# Patient Record
Sex: Female | Born: 1955
Health system: Southern US, Community
[De-identification: ages and names within clinical notes are randomized; demographics above are authoritative.]

## PROBLEM LIST (undated history)

## (undated) DIAGNOSIS — K769 Liver disease, unspecified: Secondary | ICD-10-CM

## (undated) DIAGNOSIS — N189 Chronic kidney disease, unspecified: Secondary | ICD-10-CM

## (undated) DIAGNOSIS — F419 Anxiety disorder, unspecified: Secondary | ICD-10-CM

## (undated) DIAGNOSIS — E119 Type 2 diabetes mellitus without complications: Secondary | ICD-10-CM

## (undated) DIAGNOSIS — F32A Depression, unspecified: Secondary | ICD-10-CM

## (undated) DIAGNOSIS — F329 Major depressive disorder, single episode, unspecified: Secondary | ICD-10-CM

## (undated) DIAGNOSIS — I10 Essential (primary) hypertension: Secondary | ICD-10-CM

## (undated) HISTORY — DX: Anxiety disorder, unspecified: F41.9

## (undated) HISTORY — DX: Chronic kidney disease, unspecified: N18.9

## (undated) HISTORY — PX: SHOULDER SURGERY: SHX246

## (undated) HISTORY — DX: Essential (primary) hypertension: I10

## (undated) HISTORY — DX: Liver disease, unspecified: K76.9

## (undated) HISTORY — PX: INCONTINENCE SURGERY: SHX676

## (undated) HISTORY — PX: JOINT REPLACEMENT: SHX530

## (undated) HISTORY — PX: HEMORRHOID SURGERY: SHX153

## (undated) HISTORY — DX: Type 2 diabetes mellitus without complications: E11.9

## (undated) HISTORY — PX: HERNIA REPAIR: SHX51

## (undated) HISTORY — PX: CHOLECYSTECTOMY: SHX55

## (undated) HISTORY — DX: Depression, unspecified: F32.A

## (undated) HISTORY — PX: SPINE SURGERY: SHX786

## (undated) HISTORY — PX: ABDOMINAL HYSTERECTOMY: SHX81

---
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
## 2015-11-18 HISTORY — PX: BREAST BIOPSY: SHX20

## 2017-04-30 DIAGNOSIS — M25561 Pain in right knee: Secondary | ICD-10-CM | POA: Diagnosis not present

## 2017-04-30 DIAGNOSIS — M25562 Pain in left knee: Secondary | ICD-10-CM | POA: Diagnosis not present

## 2017-04-30 DIAGNOSIS — E1165 Type 2 diabetes mellitus with hyperglycemia: Secondary | ICD-10-CM | POA: Diagnosis not present

## 2017-04-30 DIAGNOSIS — R42 Dizziness and giddiness: Secondary | ICD-10-CM | POA: Diagnosis not present

## 2017-04-30 DIAGNOSIS — Z0000 Encounter for general adult medical examination without abnormal findings: Secondary | ICD-10-CM | POA: Diagnosis not present

## 2017-04-30 DIAGNOSIS — Z760 Encounter for issue of repeat prescription: Secondary | ICD-10-CM | POA: Diagnosis not present

## 2017-05-14 DIAGNOSIS — M25561 Pain in right knee: Secondary | ICD-10-CM | POA: Diagnosis not present

## 2017-05-14 DIAGNOSIS — E1165 Type 2 diabetes mellitus with hyperglycemia: Secondary | ICD-10-CM | POA: Diagnosis not present

## 2017-05-14 DIAGNOSIS — M25562 Pain in left knee: Secondary | ICD-10-CM | POA: Diagnosis not present

## 2017-05-14 DIAGNOSIS — M545 Low back pain: Secondary | ICD-10-CM | POA: Diagnosis not present

## 2017-05-25 DIAGNOSIS — Z96651 Presence of right artificial knee joint: Secondary | ICD-10-CM | POA: Diagnosis not present

## 2017-05-25 DIAGNOSIS — M1712 Unilateral primary osteoarthritis, left knee: Secondary | ICD-10-CM | POA: Diagnosis not present

## 2017-05-25 DIAGNOSIS — T8484XA Pain due to internal orthopedic prosthetic devices, implants and grafts, initial encounter: Secondary | ICD-10-CM | POA: Diagnosis not present

## 2017-05-25 DIAGNOSIS — M6281 Muscle weakness (generalized): Secondary | ICD-10-CM | POA: Diagnosis not present

## 2017-06-26 DIAGNOSIS — T8484XA Pain due to internal orthopedic prosthetic devices, implants and grafts, initial encounter: Secondary | ICD-10-CM | POA: Diagnosis not present

## 2017-06-26 DIAGNOSIS — E119 Type 2 diabetes mellitus without complications: Secondary | ICD-10-CM | POA: Diagnosis not present

## 2017-06-26 DIAGNOSIS — B372 Candidiasis of skin and nail: Secondary | ICD-10-CM | POA: Diagnosis not present

## 2017-06-26 DIAGNOSIS — Z96659 Presence of unspecified artificial knee joint: Secondary | ICD-10-CM | POA: Diagnosis not present

## 2017-06-26 DIAGNOSIS — M25569 Pain in unspecified knee: Secondary | ICD-10-CM | POA: Diagnosis not present

## 2017-07-09 DIAGNOSIS — T8484XA Pain due to internal orthopedic prosthetic devices, implants and grafts, initial encounter: Secondary | ICD-10-CM | POA: Diagnosis not present

## 2017-07-09 DIAGNOSIS — L02214 Cutaneous abscess of groin: Secondary | ICD-10-CM | POA: Diagnosis not present

## 2017-07-09 DIAGNOSIS — R42 Dizziness and giddiness: Secondary | ICD-10-CM | POA: Diagnosis not present

## 2017-07-09 DIAGNOSIS — F419 Anxiety disorder, unspecified: Secondary | ICD-10-CM | POA: Diagnosis not present

## 2017-07-23 DIAGNOSIS — L720 Epidermal cyst: Secondary | ICD-10-CM | POA: Diagnosis not present

## 2017-07-23 DIAGNOSIS — L723 Sebaceous cyst: Secondary | ICD-10-CM | POA: Diagnosis not present

## 2017-07-23 DIAGNOSIS — Z471 Aftercare following joint replacement surgery: Secondary | ICD-10-CM | POA: Diagnosis not present

## 2017-07-23 DIAGNOSIS — L02214 Cutaneous abscess of groin: Secondary | ICD-10-CM | POA: Diagnosis not present

## 2017-07-23 DIAGNOSIS — Z96651 Presence of right artificial knee joint: Secondary | ICD-10-CM | POA: Diagnosis not present

## 2017-07-23 DIAGNOSIS — F419 Anxiety disorder, unspecified: Secondary | ICD-10-CM | POA: Diagnosis not present

## 2017-07-23 DIAGNOSIS — T8484XD Pain due to internal orthopedic prosthetic devices, implants and grafts, subsequent encounter: Secondary | ICD-10-CM | POA: Diagnosis not present

## 2017-07-27 DIAGNOSIS — H832X9 Labyrinthine dysfunction, unspecified ear: Secondary | ICD-10-CM | POA: Diagnosis not present

## 2017-07-27 DIAGNOSIS — M25511 Pain in right shoulder: Secondary | ICD-10-CM | POA: Diagnosis not present

## 2017-07-27 DIAGNOSIS — R42 Dizziness and giddiness: Secondary | ICD-10-CM | POA: Diagnosis not present

## 2017-07-27 DIAGNOSIS — M545 Low back pain: Secondary | ICD-10-CM | POA: Diagnosis not present

## 2017-07-30 ENCOUNTER — Other Ambulatory Visit: Payer: Self-pay | Admitting: Orthopedic Surgery

## 2017-07-30 DIAGNOSIS — M545 Low back pain: Secondary | ICD-10-CM

## 2017-08-04 DIAGNOSIS — R42 Dizziness and giddiness: Secondary | ICD-10-CM | POA: Diagnosis not present

## 2017-08-13 ENCOUNTER — Ambulatory Visit
Admission: RE | Admit: 2017-08-13 | Discharge: 2017-08-13 | Disposition: A | Discharge status: Home / Self Care | Payer: PPO | Source: Ambulatory Visit | Attending: Orthopedic Surgery | Admitting: Orthopedic Surgery

## 2017-08-13 DIAGNOSIS — M5126 Other intervertebral disc displacement, lumbar region: Secondary | ICD-10-CM | POA: Diagnosis not present

## 2017-08-13 DIAGNOSIS — E041 Nontoxic single thyroid nodule: Secondary | ICD-10-CM | POA: Diagnosis not present

## 2017-08-13 DIAGNOSIS — R933 Abnormal findings on diagnostic imaging of other parts of digestive tract: Secondary | ICD-10-CM | POA: Diagnosis not present

## 2017-08-13 DIAGNOSIS — M545 Low back pain: Secondary | ICD-10-CM

## 2017-08-13 DIAGNOSIS — R42 Dizziness and giddiness: Secondary | ICD-10-CM | POA: Diagnosis not present

## 2017-08-24 DIAGNOSIS — L723 Sebaceous cyst: Secondary | ICD-10-CM | POA: Diagnosis not present

## 2017-08-24 DIAGNOSIS — E041 Nontoxic single thyroid nodule: Secondary | ICD-10-CM | POA: Diagnosis not present

## 2017-08-24 DIAGNOSIS — M545 Low back pain: Secondary | ICD-10-CM | POA: Diagnosis not present

## 2017-08-24 DIAGNOSIS — M549 Dorsalgia, unspecified: Secondary | ICD-10-CM | POA: Insufficient documentation

## 2017-08-24 DIAGNOSIS — E1165 Type 2 diabetes mellitus with hyperglycemia: Secondary | ICD-10-CM | POA: Diagnosis not present

## 2017-08-24 DIAGNOSIS — L739 Follicular disorder, unspecified: Secondary | ICD-10-CM | POA: Diagnosis not present

## 2017-08-28 ENCOUNTER — Other Ambulatory Visit: Payer: Self-pay | Admitting: Physician Assistant

## 2017-08-28 DIAGNOSIS — R42 Dizziness and giddiness: Secondary | ICD-10-CM

## 2017-08-30 DIAGNOSIS — Z86010 Personal history of colonic polyps: Secondary | ICD-10-CM | POA: Diagnosis not present

## 2017-08-30 DIAGNOSIS — R1013 Epigastric pain: Secondary | ICD-10-CM | POA: Diagnosis not present

## 2017-08-30 DIAGNOSIS — K591 Functional diarrhea: Secondary | ICD-10-CM | POA: Diagnosis not present

## 2017-09-07 DIAGNOSIS — R945 Abnormal results of liver function studies: Secondary | ICD-10-CM | POA: Diagnosis not present

## 2017-09-14 DIAGNOSIS — K760 Fatty (change of) liver, not elsewhere classified: Secondary | ICD-10-CM | POA: Diagnosis not present

## 2017-09-14 DIAGNOSIS — R42 Dizziness and giddiness: Secondary | ICD-10-CM | POA: Diagnosis not present

## 2017-09-17 DIAGNOSIS — K259 Gastric ulcer, unspecified as acute or chronic, without hemorrhage or perforation: Secondary | ICD-10-CM | POA: Diagnosis not present

## 2017-09-17 DIAGNOSIS — Z01818 Encounter for other preprocedural examination: Secondary | ICD-10-CM | POA: Diagnosis not present

## 2017-09-17 DIAGNOSIS — E119 Type 2 diabetes mellitus without complications: Secondary | ICD-10-CM | POA: Diagnosis not present

## 2017-09-17 DIAGNOSIS — K591 Functional diarrhea: Secondary | ICD-10-CM | POA: Diagnosis not present

## 2017-10-04 DIAGNOSIS — K2270 Barrett's esophagus without dysplasia: Secondary | ICD-10-CM | POA: Diagnosis not present

## 2017-10-06 DIAGNOSIS — K529 Noninfective gastroenteritis and colitis, unspecified: Secondary | ICD-10-CM | POA: Diagnosis not present

## 2017-10-08 DIAGNOSIS — K529 Noninfective gastroenteritis and colitis, unspecified: Secondary | ICD-10-CM | POA: Diagnosis not present

## 2017-10-11 DIAGNOSIS — Z86010 Personal history of colonic polyps: Secondary | ICD-10-CM | POA: Diagnosis not present

## 2017-10-11 DIAGNOSIS — K259 Gastric ulcer, unspecified as acute or chronic, without hemorrhage or perforation: Secondary | ICD-10-CM | POA: Diagnosis not present

## 2017-10-11 DIAGNOSIS — K911 Postgastric surgery syndromes: Secondary | ICD-10-CM | POA: Diagnosis not present

## 2017-10-11 DIAGNOSIS — K6289 Other specified diseases of anus and rectum: Secondary | ICD-10-CM | POA: Diagnosis not present

## 2017-10-12 ENCOUNTER — Other Ambulatory Visit (HOSPITAL_COMMUNITY): Payer: Self-pay | Admitting: Gastroenterology

## 2017-10-12 DIAGNOSIS — Z86010 Personal history of colonic polyps: Secondary | ICD-10-CM

## 2017-10-12 DIAGNOSIS — Z8371 Family history of colonic polyps: Secondary | ICD-10-CM

## 2017-10-12 DIAGNOSIS — K911 Postgastric surgery syndromes: Secondary | ICD-10-CM

## 2017-10-19 ENCOUNTER — Encounter (HOSPITAL_COMMUNITY): Payer: Self-pay | Admitting: Radiology

## 2017-10-19 ENCOUNTER — Other Ambulatory Visit (HOSPITAL_COMMUNITY): Payer: Self-pay | Admitting: Gastroenterology

## 2017-10-19 ENCOUNTER — Ambulatory Visit (HOSPITAL_COMMUNITY)
Admission: RE | Admit: 2017-10-19 | Discharge: 2017-10-19 | Disposition: A | Discharge status: Home / Self Care | Payer: PPO | Source: Ambulatory Visit | Attending: Gastroenterology | Admitting: Gastroenterology

## 2017-10-19 DIAGNOSIS — Z86010 Personal history of colonic polyps: Secondary | ICD-10-CM

## 2017-10-19 DIAGNOSIS — Z8371 Family history of colonic polyps: Secondary | ICD-10-CM

## 2017-10-19 DIAGNOSIS — K911 Postgastric surgery syndromes: Secondary | ICD-10-CM

## 2017-11-15 DIAGNOSIS — K253 Acute gastric ulcer without hemorrhage or perforation: Secondary | ICD-10-CM | POA: Diagnosis not present

## 2017-11-15 DIAGNOSIS — K580 Irritable bowel syndrome with diarrhea: Secondary | ICD-10-CM | POA: Diagnosis not present

## 2017-11-26 DIAGNOSIS — Z79899 Other long term (current) drug therapy: Secondary | ICD-10-CM | POA: Diagnosis not present

## 2017-11-26 DIAGNOSIS — E559 Vitamin D deficiency, unspecified: Secondary | ICD-10-CM | POA: Diagnosis not present

## 2017-11-26 DIAGNOSIS — J45909 Unspecified asthma, uncomplicated: Secondary | ICD-10-CM | POA: Diagnosis not present

## 2017-11-26 DIAGNOSIS — E538 Deficiency of other specified B group vitamins: Secondary | ICD-10-CM | POA: Diagnosis not present

## 2017-11-26 DIAGNOSIS — E7800 Pure hypercholesterolemia, unspecified: Secondary | ICD-10-CM | POA: Diagnosis not present

## 2017-11-26 DIAGNOSIS — E1165 Type 2 diabetes mellitus with hyperglycemia: Secondary | ICD-10-CM | POA: Diagnosis not present

## 2017-11-26 DIAGNOSIS — D539 Nutritional anemia, unspecified: Secondary | ICD-10-CM | POA: Diagnosis not present

## 2017-11-26 DIAGNOSIS — R5383 Other fatigue: Secondary | ICD-10-CM | POA: Diagnosis not present

## 2017-11-26 DIAGNOSIS — E539 Vitamin B deficiency, unspecified: Secondary | ICD-10-CM | POA: Diagnosis not present

## 2017-12-13 DIAGNOSIS — R110 Nausea: Secondary | ICD-10-CM | POA: Diagnosis not present

## 2017-12-13 DIAGNOSIS — E611 Iron deficiency: Secondary | ICD-10-CM | POA: Diagnosis not present

## 2017-12-13 DIAGNOSIS — Z79899 Other long term (current) drug therapy: Secondary | ICD-10-CM | POA: Diagnosis not present

## 2017-12-13 DIAGNOSIS — E1165 Type 2 diabetes mellitus with hyperglycemia: Secondary | ICD-10-CM | POA: Diagnosis not present

## 2017-12-13 DIAGNOSIS — E860 Dehydration: Secondary | ICD-10-CM | POA: Diagnosis not present

## 2017-12-14 ENCOUNTER — Other Ambulatory Visit: Payer: Self-pay | Admitting: *Deleted

## 2017-12-14 NOTE — Patient Outreach (Addendum)
[facility](THN) Care Management  12/14/2017  Diane Gonzales Gonzales [id number]   Telephone Screen  Referral Date: 12/14/17 Referral Source: HTA referral  Referral Reason: member states she has difficulty getting around her home and completing her ADLs Insurance: HTA  Outreach attempt # 1, Successful at the mobile number listed in EPIC Patient is able to verify HIPAA Reviewed and addressed [facility] with [facility] She reports she is unsteady in gait and  has vertigo/dizzy She denies any falls in the last 3 months but reports unsteady gait. She wants someone to help with transportation, shopping and to assist her in mobility progression  "I need an aide like a personal care aide" She states she would like a spanish speaking person to assist   Social: Diane Gonzales is a Hispanic/Latino Jehovah's witness that lives with her daughter, "she co habitates with me but leaves early in morning and does not return until late because of her job" States she is originally from [place] but lived in [place] for 5 years with her son who remains in [place].  She reports her ADLs/iADLs status is independent/assist.  She reports she does not like having blood products and if needed her family members will assist with blood.  She uses her daughter or public transportation to get to her MD appointments   Conditions: DM , HTN, Kidney issues, 3 ulcers in her stomach, poor appetite, pain in stomach IBS Diverticulitis Deaf in one ear  Medications: She reports at intervals she forgets to take her medications as prescribed and is not sure she is having side effects from them.  She has 14 medicines   Appointments: saw [employer] JohnsonStacey Gonzales on 12/13/17 and see her q 1-3 months  Advance Directives: Does not want to be on tubes and is interested in speaking with [facility] SW about advance directives    Consent: [facility] RN CM [facility] with [facility]. Patient gave verbal consent for [facility] RN CM,  pharmacy and SW   Plan[facility] will [facility] RN CM community (management of chronic conditions plus is a fall risk with poor support system, poly medinces), pharmacy (Polypharmacy med review-patient taking greater than 10 meds- Has 14 pills  Of these 14 she reports she is not sure if she is having side effects Reports unsteady in gait and has vertigo/dizzy She denies any falls in the last 3 months but reports unsteady gait.) and Child psychotherapistocial worker (alternate transportation resources, advance directives resources and caregiver support services)   Cala BradfordKimberly L. Noelle PennerGibbs, RN, BSN, CCM [facility] Telephonic Care Management Care Coordinator Direct number [phone]  [facility] number [phone] Fax number [phone]

## 2017-12-17 ENCOUNTER — Encounter: Payer: Self-pay | Admitting: *Deleted

## 2017-12-17 ENCOUNTER — Other Ambulatory Visit: Payer: Self-pay | Admitting: Pharmacist

## 2017-12-17 NOTE — Patient Outreach (Signed)
[facility](THN) Care Management  12/17/2017  Diane MannsDolores Gonzales 08/18/1955 [id number]   Received referral for patient regarding potential polypharmacy and potential side effects from medications.  Call was made to patient, however she was unable to discuss any information because she said her "ulcer" was hurting. She described the pain as chronic and requested that we call back after her GI  procedure on 12/24/17.  Plan: -Will plan to call patient back on 12/25/17  Beecher McardleKatina J. Elivia Robotham, PharmD, [facility]ACP [facility] Clinical Pharmacist [phone]

## 2017-12-19 ENCOUNTER — Other Ambulatory Visit: Payer: Self-pay

## 2017-12-19 NOTE — Patient Outreach (Signed)
[facility](THN) Care Management  12/19/2017  Starling MannsDolores Lesmeister 05/17/1956 [id number]   Referral received on 12/17/17 per telephonic care coordinator: need further in home evaluation assessmet of care needs; management of chronic conditions, fall risk.  62 year old with history of DM,HTN, kidney issues, stomach ulcers, IBS.  RNCM called to schedule home visit. No answer. HIPPA compliant message.  Plan: send outreach letter and follow up within 3-4 business days if no return call.  Kathyrn SheriffJuana Hyacinth Marcelli, RN, MSN, [facility]BS[facility],CCM [facility] Community Care Coordinator Cell: [phone]

## 2017-12-21 ENCOUNTER — Other Ambulatory Visit: Payer: Self-pay

## 2017-12-21 NOTE — Patient Outreach (Signed)
[facility] ([facility]) Care Management  12/21/2017  Diane Gonzales [facility]01/1956 [id number]   First outreach attempt to patient regarding social work referral.  BSW left [employer]voicemail message.  Will make second attempt to contact patient within four business days.     Malachy ChamberAmber Nikoloz Huy, BSW Social Worker [phone]

## 2017-12-24 ENCOUNTER — Other Ambulatory Visit: Payer: Self-pay

## 2017-12-24 NOTE — Patient Outreach (Signed)
[facility](THN) Care Management  12/24/2017  Diane MannsDolores Gonzales 01/17/1956 [id number]  Referral received per telephonic care coordinator on 12/17/17.  62 year old with history of back pain, headache, diabetes, HTN, kidney issues, ulcers, IBS. History of two back surgeries, right knee replacement and 4 bladder slings done in [place]. Client reports her daughter lives with her but is gone to work from the hours of 7am to 8:30pm, therefore she is home alone most of the time.  RNCM called to schedule home visit. Client reports she has multiple problems occurring at the same time. She reports pains in her stomach due to ulcers. She states that she was to have an endoscopy today, but her granddaughter was going to take her and then had care trouble. [place]. She also reports issues with headache, chronic intermittent as well as chronic back pain.   She reports issues with eating due to her ulcer and how it interferes with her blood sugars. She also acknowledges a history of vertigo and states she was told to stop the Meclizine because it interferes with the ulcers.   Plan: home visit scheduled. [facility] CM Care Plan Problem One     Most Recent Value  Care Plan Problem One  alteration in comfort   Role Documenting the Problem One  Care Management Coordinator  Care Plan for Problem One  Active  [facility]N Long Term Goal   client will verbalize decrease in pain within the next 31 days.  [facility]N Long Term Goal Start Date  12/24/17  Interventions for Problem One Long Term Goal  RNCM discussed with client her different types of pain and history of management of her pains.  TH[facility] CM Short Term Goal #1   client will follow up with primary care within the next 30 days.  [facility] CM Short Term Goal #1 Start Date  12/24/17  Interventions for Short Term Goal #1  encouraged client to follow up with provider and discussed the improtance of following up.  TH[facility] CM Short Term Goal #2   client will  take medications as prescribed within the next 30 days.  TH[facility] CM Short Term Goal #2 Start Date  12/24/17  Interventions for Short Term Goal #2  medications reviewed     Kathyrn SheriffJuana Dariela Stoker, RN, MSN, [facility] PaBSN,CCM [facility] Community Care Coordinator Cell: [phone]

## 2017-12-24 NOTE — Patient Outreach (Signed)
[facility](THN) Care Management  12/24/2017  Diane MannsDolores Gonzales 04/06/1956 [id number]   BSW received return phone call from patient regarding social work referral. Patient said that she could benefit from assistance in the home with light cleaning and meal preparation. She reported having difficulty standing for long periods of time due to multiple back surgeries.  She also reported difficulty with gait due to vertigo.  BSW asked about mobility devices and she reported that she uses a cane.  BSW asked if she has considered a walker and informed her that Medicare would cover the expense if her doctor deems it medically necessary and writes an order. She said she would discuss this at her appointment scheduled for tomorrow.  BSW informed patient that Medicare only covers in home services if in conjunction with a service such as physical therapy.  BSW talked with her about applying for Medicaid but patient said she feels certain she would not qualify due to income level and not having outstanding medical bills.  She is not interested in applying at this time.  BSW informed her that private pay would be the only option for in home services and agreed to mail a list of providers.   BSW educated patient on Advanced Directives and the process for completing this documentation.  An EMMI and the Advanced Directive packet was mailed to her.  BSW talked with patient about transportation resources. She reported that she was recently approved for [employer] and has started using the service.  BSW educated her about [employer] as another option and mailed her the information packet and application.   BSW will follow up with her next week to ensure receipt of resources and to answer any questions that she may have.   Malachy ChamberAmber Clester Gonzales, BSW Social Worker [phone]

## 2017-12-25 ENCOUNTER — Other Ambulatory Visit: Payer: Self-pay | Admitting: Pharmacist

## 2017-12-25 ENCOUNTER — Encounter: Payer: Self-pay | Admitting: Pharmacist

## 2017-12-25 ENCOUNTER — Other Ambulatory Visit: Payer: Self-pay | Admitting: Physician Assistant

## 2017-12-25 DIAGNOSIS — Z1231 Encounter for screening mammogram for malignant neoplasm of breast: Secondary | ICD-10-CM

## 2017-12-25 DIAGNOSIS — R42 Dizziness and giddiness: Secondary | ICD-10-CM | POA: Diagnosis not present

## 2017-12-25 DIAGNOSIS — J45909 Unspecified asthma, uncomplicated: Secondary | ICD-10-CM | POA: Diagnosis not present

## 2017-12-25 DIAGNOSIS — K219 Gastro-esophageal reflux disease without esophagitis: Secondary | ICD-10-CM | POA: Insufficient documentation

## 2017-12-25 DIAGNOSIS — E785 Hyperlipidemia, unspecified: Principal | ICD-10-CM

## 2017-12-25 DIAGNOSIS — K2100 Gastro-esophageal reflux disease with esophagitis, without bleeding: Secondary | ICD-10-CM

## 2017-12-25 DIAGNOSIS — I152 Hypertension secondary to endocrine disorders: Secondary | ICD-10-CM

## 2017-12-25 DIAGNOSIS — I10 Essential (primary) hypertension: Secondary | ICD-10-CM

## 2017-12-25 DIAGNOSIS — R0609 Other forms of dyspnea: Secondary | ICD-10-CM | POA: Diagnosis not present

## 2017-12-25 DIAGNOSIS — E1159 Type 2 diabetes mellitus with other circulatory complications: Secondary | ICD-10-CM | POA: Insufficient documentation

## 2017-12-25 DIAGNOSIS — E1169 Type 2 diabetes mellitus with other specified complication: Secondary | ICD-10-CM | POA: Insufficient documentation

## 2017-12-25 DIAGNOSIS — Z79899 Other long term (current) drug therapy: Secondary | ICD-10-CM | POA: Diagnosis not present

## 2017-12-25 DIAGNOSIS — E1165 Type 2 diabetes mellitus with hyperglycemia: Secondary | ICD-10-CM | POA: Diagnosis not present

## 2017-12-25 NOTE — Patient Outreach (Signed)
[facility]) Care Management  [facility]   12/25/2017  Diane Gonzales 05-26-1956 [id number]  Subjective: Patient was called regarding medication management per referral.  HIPAA identifiers were obtained.  Patient is a 62 year old female with multiple medical conditions including but not limited to:  Type 2 diabetes, hypertension, hyperlipidemia, GERD with possible stomach ulcers, chronic back pain, status post right knee replacement and 4 bladder sling surgeries.    Patient reported managing her medications on her own.  She uses a pill box but stated she often forgets to take her medications.  In addition, she said she has been drowsy lately and feels like her symptoms could be caused by her medications.  Objective:   Encounter Medications: Outpatient Encounter Medications as of 12/25/2017  Medication Sig Note  . ALPRAZolam (XANAX) 1 MG tablet Take 1 mg by mouth 3 (three) times daily.   . budesonide-formoterol (SYMBICORT) 160-4.5 MCG/ACT inhaler Inhale 2 puffs into the lungs daily.   . ferrous sulfate 325 (65 FE) MG tablet Take 325 mg by mouth daily with breakfast.   . fluticasone (FLONASE) 50 MCG/ACT nasal spray Place 2 sprays into both nostrils 2 (two) times daily. 12/24/2017: Client reports will use 2-3 times/day.  Marland Kitchen glipiZIDE (GLUCOTROL) 5 MG tablet Take 5 mg by mouth daily before breakfast.   . hydrochlorothiazide (HYDRODIURIL) 25 MG tablet Take 25 mg by mouth daily.   Marland Kitchen losartan (COZAAR) 25 MG tablet Take 25 mg by mouth daily.   . meclizine (ANTIVERT) 12.5 MG tablet Take 12.5 mg by mouth 2 (two) times daily.   . metFORMIN (GLUCOPHAGE) 1000 MG tablet Take 1,000 mg by mouth 2 (two) times daily with a meal.    . nystatin (MYCOSTATIN/NYSTOP) powder Apply topically daily.   Marland Kitchen omeprazole (PRILOSEC) 40 MG capsule Take 40 mg by mouth 2 (two) times daily.   . ondansetron (ZOFRAN) 4 MG tablet Take 4 mg by mouth every 8 (eight) hours as needed for nausea or vomiting.   Marland Kitchen  oxyCODONE (OXY IR/ROXICODONE) 5 MG immediate release tablet Take 1 tablet by mouth every 6 (six) hours as needed for pain.    . ranitidine (ZANTAC) 150 MG tablet Take 150 mg by mouth 2 (two) times daily.   . simvastatin (ZOCOR) 40 MG tablet Take 40 mg by mouth daily.   . Diclofenac Sodium CR 100 MG 24 hr tablet Take 1 tablet by mouth daily. 12/25/2017: Currently not taking due to stomach/swallowing issues.   No facility-administered encounter medications on file as of 12/25/2017.     Functional Status: In your present state of health, do you have any difficulty performing the following activities: 12/24/2017  Hearing? N  Vision? N  Difficulty concentrating or making decisions? Y  Comment "I do have a tendency to forget"  Walking or climbing stairs? Y  Dressing or bathing? Y  Comment sometimes due to dizziness  Doing errands, shopping? Y  Preparing Food and eating ? Y  Using the Toilet? N  In the past six months, have you accidently leaked urine? Y  Do you have problems with loss of bowel control? N  Managing your Medications? Y  Comment "sometimes I take my night medicines twice and sometimes I forget".  Managing your Finances? N  Housekeeping or managing your Housekeeping? Y     Assessment:  Patient's medications were reviewed via telephone.     Drugs sorted by system:  Neurologic/Psychologic: Alprazolam   Cardiovascular: Simvastatin Losartan Hydrochlorothiazide  Pulmonary/Allergy: Symbicort Flonase  Gastrointestinal: Meclizine Omeprazole Ranitidine Ondansetron  Endocrine: Metformin Glipizide  Topical: Nystatin powder  Pain: Diclofenac CR 100mg  (currently not taking) Oxycodone IR (takes PRN)  Vitamins/Minerals: Ferrous Sulfate  Medication Review Findings:  Drowsiness-patient reported taking meclizine twice daily (every day).  Since she takes this every day, it may be contributing to her drowsiness.  Patient reported taking alprazolam for years daily  without drowsiness.  Patient also reported that she only takes Oxycodone PRN and was aware it causes drowsiness especially in combination with meclizine and alprazolam.  (She reported she does not take these medications together.)  Adherence-patient reported that she often forgets to take her medications and she is unsure of what each medication is for.   Plan: Conduct a combined home visit [facility] Community Nurse on 01/02/18.   Beecher McardleKatina J. Kitti Mcclish, PharmD, BCACP [facility] Clinical Pharmacist [phone]

## 2017-12-26 ENCOUNTER — Ambulatory Visit: Payer: PPO

## 2018-01-01 ENCOUNTER — Other Ambulatory Visit: Payer: Self-pay

## 2018-01-01 NOTE — Patient Outreach (Signed)
[facility](THN) Care Management  01/01/2018  Starling MannsDolores Gonzales 07/05/1955 [id number]   Follow up call to [name] to ensure receipt of information mailed last week.  She denied receiving this information.  BSW will follow up again before the end of the week and resend information if still not received.    Malachy ChamberAmber Clarrisa Kaylor, BSW Social Worker [phone]

## 2018-01-02 ENCOUNTER — Ambulatory Visit: Payer: Self-pay | Admitting: Pharmacist

## 2018-01-02 ENCOUNTER — Ambulatory Visit: Payer: Self-pay

## 2018-01-02 ENCOUNTER — Other Ambulatory Visit: Payer: Self-pay

## 2018-01-02 NOTE — Patient Outreach (Signed)
[facility]) Care Management  [facility] Care Manager  01/02/2018   Diane Gonzales 01-27-1956 [id number]  Subjective: client reports her ulcer pain has improved some and she has an endoscopy scheduled for this week.  Objective: none  Encounter Medications:  Outpatient Encounter Medications as of 01/02/2018  Medication Sig Note  . ALPRAZolam (XANAX) 1 MG tablet Take 1 mg by mouth 3 (three) times daily.   . budesonide-formoterol (SYMBICORT) 160-4.5 MCG/ACT inhaler Inhale 2 puffs into the lungs daily.   . Diclofenac Sodium CR 100 MG 24 hr tablet Take 1 tablet by mouth daily. 12/25/2017: Currently not taking due to stomach/swallowing issues.  . ferrous sulfate 325 (65 FE) MG tablet Take 325 mg by mouth daily with breakfast.   . fluticasone (FLONASE) 50 MCG/ACT nasal spray Place 2 sprays into both nostrils 2 (two) times daily. 12/24/2017: Client reports will use 2-3 times/day.  Marland Kitchen glipiZIDE (GLUCOTROL) 5 MG tablet Take 5 mg by mouth daily before breakfast.   . hydrochlorothiazide (HYDRODIURIL) 25 MG tablet Take 25 mg by mouth daily.   Marland Kitchen losartan (COZAAR) 25 MG tablet Take 25 mg by mouth daily.   . meclizine (ANTIVERT) 12.5 MG tablet Take 12.5 mg by mouth 2 (two) times daily.   . metFORMIN (GLUCOPHAGE) 1000 MG tablet Take 1,000 mg by mouth 2 (two) times daily with a meal.    . nystatin (MYCOSTATIN/NYSTOP) powder Apply topically daily.   Marland Kitchen omeprazole (PRILOSEC) 40 MG capsule Take 40 mg by mouth 2 (two) times daily.   . ondansetron (ZOFRAN) 4 MG tablet Take 4 mg by mouth every 8 (eight) hours as needed for nausea or vomiting.   Marland Kitchen oxyCODONE (OXY IR/ROXICODONE) 5 MG immediate release tablet Take 1 tablet by mouth every 6 (six) hours as needed for pain.    . ranitidine (ZANTAC) 150 MG tablet Take 150 mg by mouth 2 (two) times daily.   . simvastatin (ZOCOR) 40 MG tablet Take 40 mg by mouth daily.    No facility-administered encounter medications on file as of 01/02/2018.     Functional  Status:  In your present state of health, do you have any difficulty performing the following activities: 12/24/2017  Hearing? N  Vision? N  Difficulty concentrating or making decisions? Y  Comment "I do have a tendency to forget"  Walking or climbing stairs? Y  Dressing or bathing? Y  Comment sometimes due to dizziness  Doing errands, shopping? Y  Preparing Food and eating ? Y  Using the Toilet? N  In the past six months, have you accidently leaked urine? Y  Do you have problems with loss of bowel control? N  Managing your Medications? Y  Comment "sometimes I take my night medicines twice and sometimes I forget".  Managing your Finances? N  Housekeeping or managing your Housekeeping? Y    Fall/Depression Screening: Fall Risk  12/24/2017  Falls in the past year? No  Risk for fall due to : Other (Comment)  Risk for fall due to: Comment "dizzy spells that come out of no where"   PHQ 2/9 Scores 01/02/2018 12/17/2017  PHQ - 2 Score 3 1  PHQ- 9 Score 15 -    Assessment:  Referral received on 12/17/17 per telephonic care coordinator: need further in home evaluation assessmet of care needs; management of chronic conditions, fall risk.  62 year old with history of DM,HTN, kidney issues, stomach ulcers, IBS.  RNCM returned call to client who states that she did not get any sleep  last night and was sound asleep when RNCM attempted to make home visit.   PHQ2 score 3 and PHQ-9 score 15: Client acknowledges some depression. She reports as a result of the problem with the ulcers and problems she has had post knee surgery, but she declines therapy or counseling. When asked about medication, Client reports she was on Geodon before, but did not like the way it made her feel. She adds "if I feel that I need some help, I will ask".  Has an endoscopy scheduled for this Friday. She states her daughter is taking her to the procedure.  Plan: home visit next week.  Diane SheriffJuana Marshel Golubski, RN, MSN, [facility],CCM [facility]  Community Care Coordinator Cell: [phone]

## 2018-01-02 NOTE — Patient Outreach (Signed)
[facility]) Care Management  01/02/2018  Diane MannsDolores Gonzales 08/19/1955 [id number]   Home visit scheduled for today. RNCM called prior to driving to client's home to confirm home visit. No answer. When RNCM arrived at client's home, There was no answer at the door. RNCM called again. HIPPA compliant message left.  Plan: continue to follow.  Kathyrn SheriffJuana Supreme Rybarczyk, RN, MSN, [facility] MonicaBSN,CCM [facility] Community Care Coordinator Cell: [phone]

## 2018-01-03 ENCOUNTER — Other Ambulatory Visit: Payer: Self-pay

## 2018-01-03 NOTE — Patient Outreach (Signed)
[facility]) Care Management  01/03/2018  Diane MannsDolores Gonzales 09/01/1955 [id number]   Follow up call to ensure receipt of Advanced Directives EMMI and packet was well as [employer] application.  [name] denied receiving this documentation.  BSW is mailing again and will follow up next week to ensure receipt.    Diane Gonzales, BSW Social Worker [phone]

## 2018-01-04 DIAGNOSIS — Z01818 Encounter for other preprocedural examination: Secondary | ICD-10-CM | POA: Diagnosis not present

## 2018-01-04 DIAGNOSIS — K259 Gastric ulcer, unspecified as acute or chronic, without hemorrhage or perforation: Secondary | ICD-10-CM | POA: Diagnosis not present

## 2018-01-04 DIAGNOSIS — E119 Type 2 diabetes mellitus without complications: Secondary | ICD-10-CM | POA: Diagnosis not present

## 2018-01-07 ENCOUNTER — Other Ambulatory Visit: Payer: Self-pay | Admitting: Pharmacist

## 2018-01-07 NOTE — Patient Outreach (Addendum)
[facility]) Care Management  01/07/2018  Starling MannsDolores Gonzales 12/11/1955 [id number]   Patient was called to reschedule our home visit. HIPAA identifiers were obtained. Patient [facility], Diane SheriffJuana Wallace, RN on this Thursday 01/10/18. Patient was not available for our last home visit. Unfortunately, I will be on PAL on Thursday.  I will follow up with the Community Nurse following the home visit.  In speaking with the patient today, she expressed concern about her stomach. She said she feels "sick on her stomach" daily and that she battles nausea daily.  She reported her blood sugars have ranged from 80mg .dl-233 mg/dl.  Plan: [facility] with [facility] Community [facility] post home visit. Follow up with the patient in 5-7 business days.  Beecher McardleKatina J. Shantana Christon, PharmD, BCACP [facility] Clinical Pharmacist [phone]

## 2018-01-10 ENCOUNTER — Other Ambulatory Visit: Payer: Self-pay

## 2018-01-10 DIAGNOSIS — R110 Nausea: Secondary | ICD-10-CM | POA: Diagnosis not present

## 2018-01-10 DIAGNOSIS — K589 Irritable bowel syndrome without diarrhea: Secondary | ICD-10-CM | POA: Diagnosis not present

## 2018-01-10 NOTE — Patient Outreach (Signed)
[facility]) Care Management   01/10/2018  Diane Diane Diane 12/16/1955 [id number]  Diane Diane is an 62 y.o. female  Subjective:   Objective:   Review of Systems  Respiratory:       Lungs clear, shortness of breath noted on exertion.  Cardiovascular:       S1S2 regular    Physical Exam skin warm dry, color within normal limits.  Encounter Medications:   Outpatient Encounter Medications as of 01/10/2018  Medication Sig Note  . albuterol (PROVENTIL HFA;VENTOLIN HFA) 108 (90 Base) MCG/ACT inhaler Use 2 puffs every 4-6 hours as needed for Shortness of breath   . ALPRAZolam (XANAX) 1 MG tablet Take 1 mg by mouth 3 (three) times daily.   . budesonide-formoterol (SYMBICORT) 160-4.5 MCG/ACT inhaler Inhale 2 puffs into the lungs daily.   . Diclofenac Sodium CR 100 MG 24 hr tablet Take 1 tablet by mouth daily. 12/25/2017: Currently not taking due to stomach/swallowing issues.  Marland Kitchen. dicyclomine (BENTYL) 10 MG capsule Take 1 capsule by mouth 4 (four) times daily.   . ferrous sulfate 325 (65 FE) MG tablet Take 325 mg by mouth daily with breakfast.   . fluticasone (FLONASE) 50 MCG/ACT nasal spray Place 2 sprays into both nostrils 2 (two) times daily. 12/24/2017: Client reports will use 2-3 times/day.  Marland Kitchen. glipiZIDE (GLUCOTROL) 5 MG tablet Take 5 mg by mouth daily before breakfast.   . hydrochlorothiazide (HYDRODIURIL) 25 MG tablet Take 25 mg by mouth daily.   Marland Kitchen. losartan (COZAAR) 25 MG tablet Take 25 mg by mouth daily.   . meclizine (ANTIVERT) 12.5 MG tablet Take 12.5 mg by mouth 2 (two) times daily.   . metFORMIN (GLUCOPHAGE) 1000 MG tablet Take 1,000 mg by mouth 2 (two) times daily with a meal.    . nystatin (MYCOSTATIN/NYSTOP) powder Apply topically daily.   Marland Kitchen. omeprazole (PRILOSEC) 40 MG capsule Take 40 mg by mouth 2 (two) times daily.   . ondansetron (ZOFRAN) 4 MG tablet Take 4 mg by mouth every 8 (eight) hours as needed for nausea or vomiting.   Marland Kitchen. oxyCODONE (OXY  IR/ROXICODONE) 5 MG immediate release tablet Take 1 tablet by mouth every 6 (six) hours as needed for pain.    . ranitidine (ZANTAC) 150 MG tablet Take 150 mg by mouth 2 (two) times daily.   . simvastatin (ZOCOR) 40 MG tablet Take 40 mg by mouth daily.   . sucralfate (CARAFATE) 1 g tablet Take 1 g by mouth 4 (four) times daily -  with meals and at bedtime.    No facility-administered encounter medications on file as of 01/10/2018.     Functional Status:   In your present state of health, do you have any difficulty performing the following activities: 12/24/2017  Hearing? N  Vision? N  Difficulty concentrating or making decisions? Y  Comment "I do have a tendency to forget"  Walking or climbing stairs? Y  Dressing or bathing? Y  Comment sometimes due to dizziness  Doing errands, shopping? Y  Preparing Food and eating ? Y  Using the Toilet? N  In the past six months, have you accidently leaked urine? Y  Do you have problems with loss of bowel control? N  Managing your Medications? Y  Comment "sometimes I take my night medicines twice and sometimes I forget".  Managing your Finances? N  Housekeeping or managing your Housekeeping? Y    Fall/Depression Screening:    Fall Risk  12/24/2017  Falls in the past year? No  Risk for fall due to : Other (Comment)  Risk for fall due to: Comment "dizzy spells that come out of no where"   PHQ 2/9 Scores 01/02/2018 12/17/2017  PHQ - 2 Score 3 1  PHQ- 9 Score 15 -   Referral received on 12/17/17 per telephonic care coordinator: need further in home evaluation assessmet of care needs; management of chronic conditions, fall risk.  Assessment: 62 year old with history of diabetes, HTN, kidney disease, IBS, stomach ulcers, poor appetite, gastric bypass.   Home visit completed: client with complaint of abdominal pain. She reports her IBS is bothering her today. She states she has an appointment today with her Gatroenterologist Dr. Cloretta Ned. Diane Diane  states she had an endoscopy last week that revealed three ulcers.  Client has a history of diabetes: Blood sugar 226 today before breakfast. Per glucose meter: 7 day ave 179; 14 day ave 165; 30 day average 161. A1C per last office visit summary was 9.9. Client expresses she would like more education on diabetes management.  Client acknowledges that she has some depression, but states it is not a problem and that she would never harm herself.  She reports she has transportation to her appointments. The [employer] bus picke up client and come to the door.  Follow up Appointments: Next appointment with PCP, client states she has not made yet. RNCM officered to call and make appointment, but client reports that she will call.  Follow up appointment with gastroenterologist today.  Problems discussed today per client: 1) abdominal issue ulcer, diverticulits, IBS Dr. Cloretta Ned managing 2) diabetes per patient A1C 9.9 3) fall risk due to dizziness- she reports she was taken off the medication(meclizine) that helped control dizziness due to her ulcers.  4) Shortness of breath noted with ambulation oxygen saturation 97%, HR 109-111.  RNCM called primary care office and spoke with Diane Diane who trasnferred the call to Diane Gonzales. RNCM informed her of client's increased shortness of breath and pulse with ambulation. Vital signs provided.   Plan: home visit in two weeks for diabetes education, send EMMI videos on diabetes, update primary care.  Kathyrn Sheriff, RN, MSN, [facility] Community Care Coordinator Cell: [phone]

## 2018-01-11 ENCOUNTER — Other Ambulatory Visit: Payer: Self-pay

## 2018-01-11 ENCOUNTER — Ambulatory Visit: Payer: Self-pay

## 2018-01-11 NOTE — Patient Outreach (Signed)
[facility]) Care Management  01/11/2018  Diane MannsDolores Gonzales 10/31/1955 [id number]   Follow up call to ensure receipt of documentation that was resent on 718/19.  [name] confirmed receipt of Advance Directives packet and EMMI as well as [employer] application.  She denied having any questions about the Advance Directive and said that she intends to complete this with her daughter.  She verbalized understanding of the documentation needing to be witnessed and notarized.  She reported that she will request that her MD upload it into Epic once it is completed and she will keep the original document. BSW is signing off at this time due to no other social work needs being identified.   Malachy ChamberAmber Brittany Osier, BSW Social Worker [phone]

## 2018-01-15 ENCOUNTER — Ambulatory Visit: Payer: PPO

## 2018-01-15 ENCOUNTER — Ambulatory Visit
Admission: RE | Admit: 2018-01-15 | Discharge: 2018-01-15 | Disposition: A | Discharge status: Home / Self Care | Payer: PPO | Source: Ambulatory Visit | Attending: Physician Assistant | Admitting: Physician Assistant

## 2018-01-15 ENCOUNTER — Other Ambulatory Visit: Payer: Self-pay | Admitting: Physician Assistant

## 2018-01-15 DIAGNOSIS — R921 Mammographic calcification found on diagnostic imaging of breast: Secondary | ICD-10-CM

## 2018-01-15 DIAGNOSIS — Z1231 Encounter for screening mammogram for malignant neoplasm of breast: Secondary | ICD-10-CM | POA: Diagnosis not present

## 2018-01-17 ENCOUNTER — Other Ambulatory Visit: Payer: Self-pay | Admitting: Pharmacist

## 2018-01-17 NOTE — Patient Outreach (Signed)
[facility]) Care Management  01/17/2018  Diane Gonzales 1956-03-20 [id number]   Patient was called for follow up after her home visit with [facility] Nurse, Diane Gonzales. HIPAA identifiers were obtained.    Patient is a 62 year old female with multiple medical conditions including but not limited to: Type 2 diabetes, hypertension, hyperlipidemia, GERD with possible stomach ulcers, chronic back pain, status post right knee replacement and 4 bladder sling surgeries.     Patient reported metoclopramide 10 mg 1 tablet twice daily and Xifaxan 550mg  1 tablet daily were added to her medication regimen by her gastroenterologist.  She said she has been able to tolerate the new medications well and said she has been feeling a little better.  She wondered if any of her medications could be causing damage to the lining of her stomach. She was advised that diclofenac and all NSAID have the potential to damage stomach lining. When we spoke initially, the patient said she was not taking diclofenac but today she reported that she is taking diclofenac.  Medications Reviewed Today    Reviewed by Diane Gonzales, [facility] (Pharmacist) on 01/17/18 at 1010  Med List Status: <None>  Medication Order Taking? Sig Documenting Provider Last Dose Status Informant  albuterol (PROVENTIL HFA;VENTOLIN HFA) 108 (90 Base) MCG/ACT inhaler [id number] Yes Use 2 puffs every 4-6 hours as needed for Shortness of breath [provider] Taking Active   ALPRAZolam Prudy Feeler) 1 MG tablet [id number] Yes Take 1 mg by mouth 3 (three) times daily. [provider] Taking Active Self  budesonide-formoterol (SYMBICORT) 160-4.5 MCG/ACT inhaler [id number] Yes Inhale 2 puffs into the lungs daily. [provider] Taking Active Self  Diclofenac Sodium CR 100 MG 24 hr tablet [id number] Yes Take 1 tablet by mouth daily. [provider] Taking Active            Med Note Diane Gonzales   Tue Dec 25, 2017 11:37 AM)  Currently not taking due to stomach/swallowing issues.  dicyclomine (BENTYL) 10 MG capsule [id number] Yes Take 1 capsule by mouth 4 (four) times daily. [provider] Taking Active   ferrous sulfate 325 (65 FE) MG tablet [id number] Yes Take 325 mg by mouth daily with breakfast. [provider] Taking Active Self  fluticasone (FLONASE) 50 MCG/ACT nasal spray [id number] Yes Place 2 sprays into both nostrils 2 (two) times daily. [provider] Taking Active Self           Med Note Jari Favre Dec 24, 2017  7:02 PM) Client reports will use 2-3 times/day.  glipiZIDE (GLUCOTROL) 5 MG tablet [id number] Yes Take 5 mg by mouth daily before breakfast. [provider] Taking Active Self  hydrochlorothiazide (HYDRODIURIL) 25 MG tablet [id number] Yes Take 25 mg by mouth daily. [provider] Taking Active Self  losartan (COZAAR) 25 MG tablet [id number] Yes Take 25 mg by mouth daily. [provider] Taking Active   meclizine (ANTIVERT) 12.5 MG tablet [id number] Yes Take 12.5 mg by mouth 2 (two) times daily. Filomena Jungling, NP Taking Active   metFORMIN (GLUCOPHAGE) 1000 MG tablet [id number] Yes Take 1,000 mg by mouth 2 (two) times daily with a meal.  [provider] Taking Active Self  metoCLOPramide (REGLAN) 10 MG tablet [id number] Yes Take 1 tablet by mouth 2 (two) times daily. [provider] Taking Active   nystatin (MYCOSTATIN/NYSTOP) powder [id number] Yes Apply topically daily. [provider] Taking Active Self  omeprazole (PRILOSEC) 40 MG capsule  [id number] Yes Take 40 mg by mouth 2 (two) times daily. [provider] Taking Active Self  ondansetron (ZOFRAN) 4 MG tablet [id number] Yes Take 4 mg by mouth every 8 (eight) hours as needed for nausea or vomiting. [provider] Taking Active Self  oxyCODONE (OXY IR/ROXICODONE) 5 MG immediate release tablet [id number] Yes Take 1 tablet by mouth every 6 (six) hours  as needed for pain.  Filomena JunglingEdwards, Jerry, NP Taking Active   ranitidine (ZANTAC) 150 MG tablet [id number] Yes Take 150 mg by mouth 2 (two) times daily. [provider] Taking Active Self  simvastatin (ZOCOR) 40 MG tablet [id number] Yes Take 40 mg by mouth daily. [provider] Taking Active Self  sucralfate (CARAFATE) 1 g tablet [id number] Yes Take 1 g by mouth 4 (four) times daily -  with meals and at bedtime. [provider] Taking Active Self  XIFAXAN 550 MG TABS tablet [id number] Yes Take 1 tablet by mouth daily. [provider] Taking Active          [facility], Diane SheriffJuana Wallace and does ot have immediate pharmacy needs.  Plan: Close patient's pharmacy case.  Patient understands that she can contact [facility] Pharmacist at any time in the future with medication related questions or concerns. Alert [facility] Community Nurse   Diane McardleKatina J. Labresha Mellor, PharmD, [facility] CountyBCACP [facility] Clinical Pharmacist [phone]

## 2018-01-24 ENCOUNTER — Other Ambulatory Visit: Payer: Self-pay

## 2018-01-24 NOTE — Patient Outreach (Signed)
[facility] [facility]) Care Management   01/24/2018  Diane Gonzales 1955/10/02 [id number]  Diane Gonzales is an 62 y.o. female  Subjective: client reports dizziness improved.  Objective:  BP 110/78   Pulse 98   Ht 1.575 m (5' 2" ) Comment: patient reported.  Wt 205 lb (93 kg) Comment: patient reported.  SpO2 98%   BMI 37.49 kg/m   Review of Systems  Respiratory:       Lungs clear, decreased.  Cardiovascular:       S1S2 noted.    Physical Exam skin warm dry, color within normal limits.  Encounter Medications:   Outpatient Encounter Medications as of 01/24/2018  Medication Sig Note  . albuterol (PROVENTIL HFA;VENTOLIN HFA) 108 (90 Base) MCG/ACT inhaler Use 2 puffs every 4-6 hours as needed for Shortness of breath   . ALPRAZolam (XANAX) 1 MG tablet Take 1 mg by mouth 3 (three) times daily.   . budesonide-formoterol (SYMBICORT) 160-4.5 MCG/ACT inhaler Inhale 2 puffs into the lungs daily.   . Diclofenac Sodium CR 100 MG 24 hr tablet Take 1 tablet by mouth daily. 12/25/2017: Currently not taking due to stomach/swallowing issues.  Marland Kitchen dicyclomine (BENTYL) 10 MG capsule Take 1 capsule by mouth 4 (four) times daily.   . fluticasone (FLONASE) 50 MCG/ACT nasal spray Place 2 sprays into both nostrils 2 (two) times daily. 12/24/2017: Client reports will use 2-3 times/day.  Marland Kitchen glipiZIDE (GLUCOTROL) 5 MG tablet Take 5 mg by mouth daily before breakfast.   . hydrochlorothiazide (HYDRODIURIL) 25 MG tablet Take 25 mg by mouth daily.   Marland Kitchen losartan (COZAAR) 25 MG tablet Take 25 mg by mouth daily.   . meclizine (ANTIVERT) 12.5 MG tablet Take 12.5 mg by mouth 2 (two) times daily. 01/24/2018: Takes as needed.  . metFORMIN (GLUCOPHAGE) 1000 MG tablet Take 1,000 mg by mouth 2 (two) times daily with a meal.    . metoCLOPramide (REGLAN) 10 MG tablet Take 1 tablet by mouth 2 (two) times daily.   Marland Kitchen nystatin (MYCOSTATIN/NYSTOP) powder Apply topically daily.   Marland Kitchen omeprazole (PRILOSEC) 40 MG capsule Take  40 mg by mouth 2 (two) times daily.   . ondansetron (ZOFRAN) 4 MG tablet Take 4 mg by mouth every 8 (eight) hours as needed for nausea or vomiting.   Marland Kitchen oxyCODONE (OXY IR/ROXICODONE) 5 MG immediate release tablet Take 1 tablet by mouth every 6 (six) hours as needed for pain.    . ranitidine (ZANTAC) 150 MG tablet Take 150 mg by mouth 2 (two) times daily.   . simvastatin (ZOCOR) 40 MG tablet Take 40 mg by mouth daily.   . sucralfate (CARAFATE) 1 g tablet Take 1 g by mouth 4 (four) times daily -  with meals and at bedtime.   Marland Kitchen XIFAXAN 550 MG TABS tablet Take 1 tablet by mouth daily. 01/24/2018: Takes three times/day.  . ferrous sulfate 325 (65 FE) MG tablet Take 325 mg by mouth daily with breakfast.    No facility-administered encounter medications on file as of 01/24/2018.     Functional Status:   In your present state of health, do you have any difficulty performing the following activities: 12/24/2017  Hearing? N  Vision? N  Difficulty concentrating or making decisions? Y  Comment "I do have a tendency to forget"  Walking or climbing stairs? Y  Dressing or bathing? Y  Comment sometimes due to dizziness  Doing errands, shopping? Y  Preparing Food and eating ? Y  Using the Toilet? N  In the  past six months, have you accidently leaked urine? Y  Do you have problems with loss of bowel control? N  Managing your Medications? Y  Comment "sometimes I take my night medicines twice and sometimes I forget".  Managing your Finances? N  Housekeeping or managing your Housekeeping? Y    Fall/Depression Screening:    Fall Risk  12/24/2017  Falls in the past year? No  Risk for fall due to : Other (Comment)  Risk for fall due to: Comment "dizzy spells that come out of no where"   PHQ 2/9 Scores 01/02/2018 12/17/2017  PHQ - 2 Score 3 1  PHQ- 9 Score 15 -    Assessment:  62 year old with history of diabetes, HTN, kidney disease, IBS, stomach ulcers, poor appetite, gastric bypass.   Routine home visit  completed. Client reports her dizziness has improved. She reports history of Vertigo and states she is supposed to schedule and appointment with her ENT. She states she is going to make the appointment at ENT [facility], otolaryngology [address], [phone].She states she is able to take her antivert now therefore she is having less dizzy spells. No falls reported.She denies pain and she denies nausea. She denies any difficulty completing ADL's.   RNCM has been working with client on diabetes education/disease management. EMMI video's assigned. Client acknowledged that she has received them, but states she has not viewed any. She declines to view video at this time stating that she needs to be able to concentrate when she looks at the video.  She is checking and recording blood sugars. Since last home visit, her low was 89 and high was 226. Morning blood sugars range 149-226. After noon blood sugars range 89-202. Client does not always check her blood sugar the second time during the day. She states because she has already eaten. RNCM reiterated with client that she can sometimes check two hours after eating if she has missed checking before her meal.  Client reports she is transitioning to a new primary care as her current physician is relocating. She has a follow up with current primary care on tomorrow.  RNCM discussed transitioning to health coach for continued disease management regarding diabetes. Client is agreeable.  Plan: transition to health coach. [facility] CM Care Plan Problem One     Most Recent Value  Care Plan Problem One  alteration in comfort   Role Documenting the Problem One  Care Management [place] for Problem One  Active  [facility] Long Term Goal   client will verbalize decrease in pain within the next 31 days.  [facility] Long Term Goal Start Date  12/24/17  [facility] Long Term Goal Met Date  01/02/18  [facility] CM Short Term Goal #1   client will follow up with primary care  within the next 30 days.  [facility] CM Short Term Goal #1 Start Date  12/24/17  [facility] for Short Term Goal #1  encouraged client to follow through with follow up appointment scheduled for tomorrow.  [facility] CM Short Term Goal #2   client will take medications as prescribed within the next 30 days.  [facility] CM Short Term Goal #2 Start Date  12/24/17  [facility] CM Short Term Goal #2 Met Date  01/24/18    [facility] CM Care Plan Problem Two     Most Recent Value  Care Plan Problem Two  knowledge deficit regarding diabetes managment as evidence by client request for more information.  Role Documenting the Problem  Two  Care Management [place] for Problem Two  Active  Interventions for Problem Two Long Term Goal   RNCM reviewed clients blood sugar readings with her., encouraged client to continue to check and record readings, encouraged client to view EMMI material assigned to her.  [facility] Long Term Goal  client will decrease A1C by 1-2 points within the next 90 days.  [facility] Long Term Goal Start Date  01/10/18  [facility] CM Short Term Goal #1   client will follow up with provider regarding new CBG meter within the next 30 days.  [facility] CM Short Term Goal #1 Start Date  01/10/18  Inter[facility] for Short Term Goal #2   RNCM encouraged client to discuss her request for a new meter.  [facility] CM Short Term Goal #2   client will review EMMI material and prepare questions if any within the next 30 days.  [facility] CM Short Term Goal #2 Start Date  01/10/18  Inter[facility] for Short Term Goal #2  encouraged client to review emmi material. discused with client if there was another preferance to received education, also provided diabetes booklet.     Thea Silversmith, RN, MSN, [place] Coordinator Cell: [phone]

## 2018-01-25 DIAGNOSIS — I10 Essential (primary) hypertension: Secondary | ICD-10-CM | POA: Diagnosis not present

## 2018-01-25 DIAGNOSIS — E7800 Pure hypercholesterolemia, unspecified: Secondary | ICD-10-CM | POA: Diagnosis not present

## 2018-01-25 DIAGNOSIS — E119 Type 2 diabetes mellitus without complications: Secondary | ICD-10-CM | POA: Diagnosis not present

## 2018-01-25 DIAGNOSIS — T782XXA Anaphylactic shock, unspecified, initial encounter: Secondary | ICD-10-CM | POA: Diagnosis not present

## 2018-02-01 DIAGNOSIS — E1165 Type 2 diabetes mellitus with hyperglycemia: Secondary | ICD-10-CM | POA: Diagnosis not present

## 2018-02-01 DIAGNOSIS — R42 Dizziness and giddiness: Secondary | ICD-10-CM | POA: Diagnosis not present

## 2018-02-04 ENCOUNTER — Other Ambulatory Visit: Payer: Self-pay

## 2018-02-04 NOTE — Patient Outreach (Signed)
[facility]) Care Management  02/04/2018  Diane MannsDolores Gonzales 10/12/1955 [id number]    Telephone Screen Referral Date : 02/01/2018 Referral Source:[facility] Community care management referral Referral Reason: Free phone service and help walking Insurance: HTA  Outreach attempt # 1 To patient. No answer. HIPAA compliant voicemail message left with contact information. Call to the patient for referral and initial assessment.  Plan: RN Health Coach will send letter. RN Health Coach will make another attempt to the patient within four business days.    Diane Fairlyraci Merridith Dershem RN, BSN, [facility] RN Health Coach Disease Management [facility] Direct Dial:  [phone] Fax: [phone]

## 2018-02-06 DIAGNOSIS — H6691 Otitis media, unspecified, right ear: Secondary | ICD-10-CM | POA: Diagnosis not present

## 2018-02-06 DIAGNOSIS — E1165 Type 2 diabetes mellitus with hyperglycemia: Secondary | ICD-10-CM | POA: Diagnosis not present

## 2018-02-07 ENCOUNTER — Other Ambulatory Visit: Payer: Self-pay

## 2018-02-07 NOTE — Patient Outreach (Signed)
[facility]) Care Management  02/07/2018  Diane MannsDolores Gonzales 04/18/1956 [id number]     Telephone Screen Referral Date : 02/01/2018 Referral Source:[facility] Community care management referral Referral Reason: Free phone service and help walking Insurance: HTA  Outreach attempt # 2 To patient. No answer. HIPAA compliant voicemail message left with contact information. Call to the patient for referral and initial assessment.  Plan: RN Health Coach will make another outreach attempt to the patient within the next four business days.  [name] Shemeka Wardle RN, BSN, [facility] RN Health Coach Disease Management [facility] Direct Dial:  [phone]  Fax: [phone]

## 2018-02-07 NOTE — Patient Outreach (Signed)
[facility]) Care Management  02/07/2018   Diane Gonzales [birth year] [id number]  Telephone Screen Referral Date :02/01/2018 Referral Source:[facility] care management referral Referral Reason:Free phone service and help walking Insurance:HTA  Outreach attempt #  2 patient returned call. HIPAA verified by the patient. All issues addressed.  The patient stated that she would like to receive free cell phone service.  Discussed with the patient about [employer].   She was given the number to call [phone] and qualifications.  The patient verbalized understanding and wrote down the number and  stated that she would call.  The patient was given information regarding her benefits for home health agency by the insurance concierge .  Advised the patient to call and speak with her physician office about getting a referral to home health for physical therapy.  The patient verbalized understanding.    Outreach attempt # 2 to the patient for initial assessment.  Patient able to verify HIPAA.  Explained to the patient health coach role and [facility] services.  Patient is open to the call and verbally agreed to services.      Social:  The patient lives in the home with her daughter.  She states that she is able to perform her ADLS/IADLS but is it becoming harder for her.  She states that she has transportation to her appointments.  Durable medical equipment in the hoe consist of can, CBG meter and eyeglasses.    Conditions: Per chart review and speaking with the patient her conditions include DM type II, GERD, IBS, Chronic back pain, HTN, Hyperlipidemia, Right knee replacement, and stomach ulcers.  The patient denies any falls but does state that she has chronic pain that she rates at a 9/10.  The patient states that her blood sugar today was 104 on her libre meter.  The patient states that she does not eat as she should.  She states that she has a hard time fixing  meals.Standing on her feet for a long time is hard.  Discussed with the patient about meal prepping for the week.The patient was also given information to have an appointment made with a nutritionist.   She verbalized understanding. She states at her last visit with Dr. Floria Raveling her a1c was 8.5.     Medications: The patient is on nineteen medications.  She is being adherent with her medications.  She does not express any problems with paying for medications.  She has been in contact with [facility] pharmacist for medication management.   Appointments:Dr Wingate on  02/06/2018 and has an appointment with gastroenterologist on 9/15. The patient states that she is having an appointment scheduled for her with Dr. Shawnee Knapp Ear nose and throat specialist and with [facility] for an eye exam..    Advanced Directives: The patient was sent information about the advanced directive and has received it.  She states that she is looking into having the paper work  filled out.     Current Medications:  Current Outpatient Medications  Medication Sig Dispense Refill  . albuterol (PROVENTIL HFA;VENTOLIN HFA) 108 (90 Base) MCG/ACT inhaler Use 2 puffs every 4-6 hours as needed for Shortness of breath    . ALPRAZolam (XANAX) 1 MG tablet Take 1 mg by mouth 3 (three) times daily.    . budesonide-formoterol (SYMBICORT) 160-4.5 MCG/ACT inhaler Inhale 2 puffs into the lungs daily.    . Diclofenac Sodium CR 100 MG 24 hr tablet Take 1 tablet by mouth daily.    Marland Kitchen  dicyclomine (BENTYL) 10 MG capsule Take 1 capsule by mouth 4 (four) times daily.    . ferrous sulfate 325 (65 FE) MG tablet Take 325 mg by mouth daily with breakfast.    . fluticasone (FLONASE) 50 MCG/ACT nasal spray Place 2 sprays into both nostrils 2 (two) times daily.    Marland Kitchen. glipiZIDE (GLUCOTROL) 5 MG tablet Take 5 mg by mouth daily before breakfast.    . hydrochlorothiazide (HYDRODIURIL) 25 MG tablet Take 25 mg by mouth daily.    Marland Kitchen. losartan (COZAAR) 25 MG tablet Take  25 mg by mouth daily.    . meclizine (ANTIVERT) 12.5 MG tablet Take 12.5 mg by mouth 2 (two) times daily.    . metFORMIN (GLUCOPHAGE) 1000 MG tablet Take 1,000 mg by mouth 2 (two) times daily with a meal.     . metoCLOPramide (REGLAN) 10 MG tablet Take 1 tablet by mouth 2 (two) times daily.    Marland Kitchen. nystatin (MYCOSTATIN/NYSTOP) powder Apply topically daily.    Marland Kitchen. omeprazole (PRILOSEC) 40 MG capsule Take 40 mg by mouth 2 (two) times daily.    . ondansetron (ZOFRAN) 4 MG tablet Take 4 mg by mouth every 8 (eight) hours as needed for nausea or vomiting.    Marland Kitchen. oxyCODONE (OXY IR/ROXICODONE) 5 MG immediate release tablet Take 1 tablet by mouth every 6 (six) hours as needed for pain.     . ranitidine (ZANTAC) 150 MG tablet Take 150 mg by mouth 2 (two) times daily.    . simvastatin (ZOCOR) 40 MG tablet Take 40 mg by mouth daily.    . sucralfate (CARAFATE) 1 g tablet Take 1 g by mouth 4 (four) times daily -  with meals and at bedtime.    Marland Kitchen. XIFAXAN 550 MG TABS tablet Take 1 tablet by mouth daily.     No current facility-administered medications for this visit.     Functional Status:  In your present state of health, do you have any difficulty performing the following activities: 02/07/2018 12/24/2017  Hearing? N N  Vision? N N  Difficulty concentrating or making decisions? N Y  Comment - "I do have a tendency to forget"  Walking or climbing stairs? Y Y  Dressing or bathing? Y Y  Comment - sometimes due to dizziness  Doing errands, shopping? N Y  Quarry managerreparing Food and eating ? - Y  Using the Toilet? - N  In the past six months, have you accidently leaked urine? - Y  Do you have problems with loss of bowel control? - N  Managing your Medications? - Y  Comment - "sometimes I take my night medicines twice and sometimes I forget".  Managing your Finances? - N  Housekeeping or managing your Housekeeping? - Y    Fall/Depression Screening: Fall Risk  02/07/2018 12/24/2017  Falls in the past year? No No  Risk  for fall due to : - Other (Comment)  Risk for fall due to: Comment - "dizzy spells that come out of no where"   PHQ 2/9 Scores 02/07/2018 01/02/2018 12/17/2017  PHQ - 2 Score 3 3 1   PHQ- 9 Score - 15 -    Assessment: Patient will benefit from health coach outreach for disease management and support.   [facility] CM Care Plan Problem One     Most Recent Value  Care Plan Problem One  knowledge deficit related to diease management.  Role Documenting the Problem One  Health Coach  Care Plan for Problem One  Active  [facility]  Long Term Goal   IN 90 days the patient will verbalize lowering her 8.5 a1c by 1-2 points  [facility] Long Term Goal Start Date  02/07/18  Interventions for Problem One Long Term Goal     Reviewed cbg record,  discussed dietary intake,  reviewed and discussed medication management       Plan: RN Health Coach will provide ongoing education for patient on diabetes through phone calls and sending printed information to patient for further discussion.  RN Health Coach will send welcome packet with consent to patient as well as printed information on diabetes  RN Health Coach will send initial barriers letter, assessment, and care plan to primary care physician.  RN Health Coach will contact patient in the month of September  and patient agrees to next outreach.  [name] RN, BSN, [facility] RN Health Coach Disease Management [facility] Dial:  [phone]  Fax: [phone]

## 2018-02-13 ENCOUNTER — Ambulatory Visit: Payer: Self-pay

## 2018-02-20 DIAGNOSIS — E785 Hyperlipidemia, unspecified: Secondary | ICD-10-CM | POA: Diagnosis not present

## 2018-02-20 DIAGNOSIS — E1121 Type 2 diabetes mellitus with diabetic nephropathy: Secondary | ICD-10-CM | POA: Diagnosis not present

## 2018-02-20 DIAGNOSIS — I10 Essential (primary) hypertension: Secondary | ICD-10-CM | POA: Diagnosis not present

## 2018-02-20 DIAGNOSIS — E1169 Type 2 diabetes mellitus with other specified complication: Secondary | ICD-10-CM | POA: Diagnosis not present

## 2018-02-20 DIAGNOSIS — E1159 Type 2 diabetes mellitus with other circulatory complications: Secondary | ICD-10-CM | POA: Diagnosis not present

## 2018-02-20 DIAGNOSIS — E1165 Type 2 diabetes mellitus with hyperglycemia: Secondary | ICD-10-CM | POA: Diagnosis not present

## 2018-02-26 ENCOUNTER — Ambulatory Visit: Payer: PPO | Admitting: Registered"

## 2018-03-07 DIAGNOSIS — R110 Nausea: Secondary | ICD-10-CM | POA: Diagnosis not present

## 2018-03-07 DIAGNOSIS — K289 Gastrojejunal ulcer, unspecified as acute or chronic, without hemorrhage or perforation: Secondary | ICD-10-CM | POA: Diagnosis not present

## 2018-03-07 DIAGNOSIS — K589 Irritable bowel syndrome without diarrhea: Secondary | ICD-10-CM | POA: Diagnosis not present

## 2018-03-11 ENCOUNTER — Other Ambulatory Visit: Payer: Self-pay

## 2018-03-11 NOTE — Patient Outreach (Signed)
[facility]) Care Management  03/11/2018   Diane MannsDolores Gonzales 02/12/1956 [id number]  Subjective: Telephone call placed to the patient for assessment. HIPAA verified.  The patient states that she is doing good.  She did receive the information that was mailed to her.  She denies any pain or falls.  The patient states that her blood sugar this morning fasting was 141. She states that she is taking her medications as prescribed.  The patient states that she has two changes with her medications.  She is now taking Janumet 50-1000 mg and stopped metformin.  Cholestyramine 4g was added as well.  She states that she has seen Diane Gonzales this month her endocrinologist and they discussed diet and exercise.  She has put her appointment with ENT on hold due to the antibiotics she has been on has helped with her dizziness.  She has an appointment to [facility]  She has an appointment with Diane Gonzales 9/25 and she plans to receive a flu shot.  She states that Dr. Osie Gonzales is scheduling her an appointment to see a surgeon for her ulcers    Current Medications:  Current Outpatient Medications  Medication Sig Dispense Refill  . albuterol (PROVENTIL HFA;VENTOLIN HFA) 108 (90 Base) MCG/ACT inhaler Use 2 puffs every 4-6 hours as needed for Shortness of breath    . ALPRAZolam (XANAX) 1 MG tablet Take 1 mg by mouth 3 (three) times daily.    . budesonide-formoterol (SYMBICORT) 160-4.5 MCG/ACT inhaler Inhale 2 puffs into the lungs daily.    . CHOLESTYRAMINE PO Take 4 g by mouth 2 (two) times daily.    Marland Kitchen. dicyclomine (BENTYL) 10 MG capsule Take 1 capsule by mouth 4 (four) times daily.    . fluticasone (FLONASE) 50 MCG/ACT nasal spray Place 2 sprays into both nostrils 2 (two) times daily.    Marland Kitchen. glipiZIDE (GLUCOTROL) 5 MG tablet Take 5 mg by mouth daily before breakfast.    . hydrochlorothiazide (HYDRODIURIL) 25 MG tablet Take 25 mg by mouth daily.    Marland Kitchen. JANUMET XR 50-1000 MG  TB24 Use as directed in the mouth or throat once.    Marland Kitchen. losartan (COZAAR) 25 MG tablet Take 25 mg by mouth daily.    . meclizine (ANTIVERT) 12.5 MG tablet Take 12.5 mg by mouth 2 (two) times daily.    . metoCLOPramide (REGLAN) 10 MG tablet Take 1 tablet by mouth 2 (two) times daily.    Marland Kitchen. nystatin (MYCOSTATIN/NYSTOP) powder Apply topically daily.    Marland Kitchen. omeprazole (PRILOSEC) 40 MG capsule Take 40 mg by mouth 2 (two) times daily.    . ondansetron (ZOFRAN) 4 MG tablet Take 4 mg by mouth every 8 (eight) hours as needed for nausea or vomiting.    . ranitidine (ZANTAC) 150 MG tablet Take 150 mg by mouth 2 (two) times daily.    . simvastatin (ZOCOR) 40 MG tablet Take 40 mg by mouth daily.    . sucralfate (CARAFATE) 1 g tablet Take 1 g by mouth 4 (four) times daily -  with meals and at bedtime.    Marland Kitchen. XIFAXAN 550 MG TABS tablet Take 1 tablet by mouth daily.    . Diclofenac Sodium CR 100 MG 24 hr tablet Take 1 tablet by mouth daily.    . ferrous sulfate 325 (65 FE) MG tablet Take 325 mg by mouth daily with breakfast.    . metFORMIN (GLUCOPHAGE) 1000 MG tablet Take 1,000 mg by  mouth 2 (two) times daily with a meal.     . oxyCODONE (OXY IR/ROXICODONE) 5 MG immediate release tablet Take 1 tablet by mouth every 6 (six) hours as needed for pain.      No current facility-administered medications for this visit.     Functional Status:  In your present state of health, do you have any difficulty performing the following activities: 02/07/2018 12/24/2017  Hearing? N N  Vision? N N  Difficulty concentrating or making decisions? N Y  Comment - "I do have a tendency to forget"  Walking or climbing stairs? Y Y  Dressing or bathing? Y Y  Comment - sometimes due to dizziness  Doing errands, shopping? N Y  Quarry manager and eating ? - Y  Using the Toilet? - N  In the past six months, have you accidently leaked urine? - Y  Do you have problems with loss of bowel control? - N  Managing your Medications? - Y  Comment  - "sometimes I take my night medicines twice and sometimes I forget".  Managing your Finances? - N  Housekeeping or managing your Housekeeping? - Y    Fall/Depression Screening: Fall Risk  03/11/2018 02/07/2018 12/24/2017  Falls in the past year? No No No  Risk for fall due to : - - Other (Comment)  Risk for fall due to: Comment - - "dizzy spells that come out of no where"   PHQ 2/9 Scores 02/07/2018 01/02/2018 12/17/2017  PHQ - 2 Score 3 3 1   PHQ- 9 Score - 15 -    Assessment: Patient will continue to benefit from health coach outreach for disease management and support.  [facility] CM Care Plan Problem One     Most Recent Value  [facility] Long Term Goal   IN 90 days the patient will verbalize lowering her 8.5 a1c by 1-2 points  [facility] Long Term Goal Start Date  03/11/18  Interventions for Problem One Long Term Goal  reviewed cbg record, medication adherence, diet, and exercise      Plan: RN Health Coach will contact patient in the month of December and patient agrees to next outreach.  Diane Fairly RN, BSN, [facility] RN Health Coach Disease Management [facility] Dial:  [phone]  Fax: [phone]

## 2018-03-12 DIAGNOSIS — R5383 Other fatigue: Secondary | ICD-10-CM | POA: Diagnosis not present

## 2018-03-12 DIAGNOSIS — I10 Essential (primary) hypertension: Secondary | ICD-10-CM | POA: Diagnosis not present

## 2018-03-12 DIAGNOSIS — D539 Nutritional anemia, unspecified: Secondary | ICD-10-CM | POA: Diagnosis not present

## 2018-03-12 DIAGNOSIS — E7800 Pure hypercholesterolemia, unspecified: Secondary | ICD-10-CM | POA: Diagnosis not present

## 2018-03-12 DIAGNOSIS — Z23 Encounter for immunization: Secondary | ICD-10-CM | POA: Diagnosis not present

## 2018-03-12 DIAGNOSIS — E119 Type 2 diabetes mellitus without complications: Secondary | ICD-10-CM | POA: Diagnosis not present

## 2018-03-12 DIAGNOSIS — Z79899 Other long term (current) drug therapy: Secondary | ICD-10-CM | POA: Diagnosis not present

## 2018-03-27 DIAGNOSIS — E119 Type 2 diabetes mellitus without complications: Secondary | ICD-10-CM | POA: Diagnosis not present

## 2018-03-28 DIAGNOSIS — E1165 Type 2 diabetes mellitus with hyperglycemia: Secondary | ICD-10-CM | POA: Diagnosis not present

## 2018-03-28 DIAGNOSIS — R9431 Abnormal electrocardiogram [ECG] [EKG]: Secondary | ICD-10-CM | POA: Diagnosis not present

## 2018-03-28 DIAGNOSIS — R0989 Other specified symptoms and signs involving the circulatory and respiratory systems: Secondary | ICD-10-CM | POA: Diagnosis not present

## 2018-04-03 DIAGNOSIS — Z760 Encounter for issue of repeat prescription: Secondary | ICD-10-CM | POA: Diagnosis not present

## 2018-04-03 DIAGNOSIS — J45909 Unspecified asthma, uncomplicated: Secondary | ICD-10-CM | POA: Diagnosis not present

## 2018-04-03 DIAGNOSIS — R9431 Abnormal electrocardiogram [ECG] [EKG]: Secondary | ICD-10-CM | POA: Diagnosis not present

## 2018-04-03 DIAGNOSIS — R05 Cough: Secondary | ICD-10-CM | POA: Diagnosis not present

## 2018-04-03 DIAGNOSIS — J019 Acute sinusitis, unspecified: Secondary | ICD-10-CM | POA: Diagnosis not present

## 2018-04-06 DIAGNOSIS — R739 Hyperglycemia, unspecified: Secondary | ICD-10-CM | POA: Diagnosis not present

## 2018-04-06 DIAGNOSIS — R110 Nausea: Secondary | ICD-10-CM | POA: Diagnosis not present

## 2018-04-06 DIAGNOSIS — E860 Dehydration: Secondary | ICD-10-CM | POA: Diagnosis not present

## 2018-04-06 DIAGNOSIS — E1165 Type 2 diabetes mellitus with hyperglycemia: Secondary | ICD-10-CM | POA: Diagnosis not present

## 2018-04-16 DIAGNOSIS — E1165 Type 2 diabetes mellitus with hyperglycemia: Secondary | ICD-10-CM | POA: Diagnosis not present

## 2018-05-03 DIAGNOSIS — R0602 Shortness of breath: Secondary | ICD-10-CM | POA: Diagnosis not present

## 2018-05-03 DIAGNOSIS — J40 Bronchitis, not specified as acute or chronic: Secondary | ICD-10-CM | POA: Diagnosis not present

## 2018-05-03 DIAGNOSIS — R5383 Other fatigue: Secondary | ICD-10-CM | POA: Diagnosis not present

## 2018-05-03 DIAGNOSIS — E7800 Pure hypercholesterolemia, unspecified: Secondary | ICD-10-CM | POA: Diagnosis not present

## 2018-05-03 DIAGNOSIS — E1165 Type 2 diabetes mellitus with hyperglycemia: Secondary | ICD-10-CM | POA: Diagnosis not present

## 2018-05-03 DIAGNOSIS — R05 Cough: Secondary | ICD-10-CM | POA: Diagnosis not present

## 2018-05-03 DIAGNOSIS — R300 Dysuria: Secondary | ICD-10-CM | POA: Diagnosis not present

## 2018-05-08 DIAGNOSIS — K289 Gastrojejunal ulcer, unspecified as acute or chronic, without hemorrhage or perforation: Secondary | ICD-10-CM | POA: Diagnosis not present

## 2018-05-08 DIAGNOSIS — E7800 Pure hypercholesterolemia, unspecified: Secondary | ICD-10-CM | POA: Diagnosis not present

## 2018-05-08 DIAGNOSIS — R1084 Generalized abdominal pain: Secondary | ICD-10-CM | POA: Diagnosis not present

## 2018-05-08 DIAGNOSIS — I10 Essential (primary) hypertension: Secondary | ICD-10-CM | POA: Diagnosis not present

## 2018-05-08 DIAGNOSIS — E1165 Type 2 diabetes mellitus with hyperglycemia: Secondary | ICD-10-CM | POA: Diagnosis not present

## 2018-05-08 DIAGNOSIS — Z23 Encounter for immunization: Secondary | ICD-10-CM | POA: Diagnosis not present

## 2018-05-27 DIAGNOSIS — K289 Gastrojejunal ulcer, unspecified as acute or chronic, without hemorrhage or perforation: Secondary | ICD-10-CM | POA: Diagnosis not present

## 2018-05-27 DIAGNOSIS — Z9884 Bariatric surgery status: Secondary | ICD-10-CM | POA: Diagnosis not present

## 2018-05-27 DIAGNOSIS — B9681 Helicobacter pylori [H. pylori] as the cause of diseases classified elsewhere: Secondary | ICD-10-CM | POA: Diagnosis not present

## 2018-06-03 ENCOUNTER — Other Ambulatory Visit: Payer: Self-pay

## 2018-06-03 NOTE — Patient Outreach (Signed)
[facility]) Care Management  06/03/2018  Diane MannsDolores Gonzales 01/27/1956 [id number]    1st unsuccessful outreach to the patient for assessment. No answer. HIPAA compliant voicemail left with contact information.  Plan: Rn Health Coach will make outreach attempt the patient within thirty business days.  Juanell Gonzales Diane Shankland RN, BSN, [facility] RN Health Coach Disease Management [facility] Direct Dial:  [phone]  Fax: [phone]

## 2018-06-11 DIAGNOSIS — Z79899 Other long term (current) drug therapy: Secondary | ICD-10-CM | POA: Diagnosis not present

## 2018-06-11 DIAGNOSIS — J984 Other disorders of lung: Secondary | ICD-10-CM | POA: Diagnosis not present

## 2018-06-11 DIAGNOSIS — Z9884 Bariatric surgery status: Secondary | ICD-10-CM | POA: Diagnosis not present

## 2018-06-11 DIAGNOSIS — I10 Essential (primary) hypertension: Secondary | ICD-10-CM | POA: Diagnosis not present

## 2018-06-11 DIAGNOSIS — K289 Gastrojejunal ulcer, unspecified as acute or chronic, without hemorrhage or perforation: Secondary | ICD-10-CM | POA: Diagnosis not present

## 2018-06-18 ENCOUNTER — Other Ambulatory Visit: Payer: Self-pay

## 2018-06-18 NOTE — Patient Outreach (Signed)
[facility]) Care Management  06/18/2018  Diane MannsDolores Gonzales 06/04/1956 [id number]    2nd attempt to outreach the patient for assessment. No answer. HIPAA compliant voicemail left with contact information.  Plan: RN Health Coach will send letter and outreach the patient within thirty days.  Diane Fairlyraci Shalece Staffa RN, BSN, [facility] RN Health Coach Disease Management [facility] Dial:  [phone]  Fax: [phone]

## 2018-06-18 NOTE — Patient Outreach (Signed)
[facility]) Care Management  06/18/2018   Starling MannsDolores Gajda 01/10/1956 [id number]  Subjective: Received call from the patient for assessment.  HIPAA verified. The patient states that she has been having stomach issues.  She states that she has had a lot of test in the last few months and she has IBS and  Ulcers.  She still has pain that she rates at a 3/10 and nausea.  She states that she has had some teeth pulled and is unable to eat hard foods.  She states that she  Is very picky about what she eats so she has been eating macaroni and cheese, potato salad and toast. She states that her blood sugar this morning was 220 and they have been ranging between 200 to 400.  Discussed with the patient about food options and how what she is eating is increasing her blood sugars.  The patient verbalized understanding.  She states that she takes her medications as prescribed but is not able to exercise. I discussed with the patient about doing exercises in the home and I will send her some information on chair exercises.  The patient has an appointment January 15th for an upper GI and February 20th with Dr Massie MaroonMcCoy.    Current Medications:  Current Outpatient Medications  Medication Sig Dispense Refill  . albuterol (PROVENTIL HFA;VENTOLIN HFA) 108 (90 Base) MCG/ACT inhaler Use 2 puffs every 4-6 hours as needed for Shortness of breath    . ALPRAZolam (XANAX) 1 MG tablet Take 1 mg by mouth 3 (three) times daily.    . budesonide-formoterol (SYMBICORT) 160-4.5 MCG/ACT inhaler Inhale 2 puffs into the lungs daily.    Marland Kitchen. dicyclomine (BENTYL) 10 MG capsule Take 1 capsule by mouth 4 (four) times daily.    . fluticasone (FLONASE) 50 MCG/ACT nasal spray Place 2 sprays into both nostrils 2 (two) times daily.    Marland Kitchen. glipiZIDE (GLUCOTROL) 5 MG tablet Take 5 mg by mouth daily before breakfast.    . hydrochlorothiazide (HYDRODIURIL) 25 MG tablet Take 25 mg by mouth daily.    Marland Kitchen. JANUMET XR 50-1000 MG TB24 Use  as directed in the mouth or throat once.    Marland Kitchen. losartan (COZAAR) 25 MG tablet Take 25 mg by mouth daily.    . metFORMIN (GLUCOPHAGE) 1000 MG tablet Take 1,000 mg by mouth 2 (two) times daily with a meal.     . nystatin (MYCOSTATIN/NYSTOP) powder Apply topically daily.    Marland Kitchen. omeprazole (PRILOSEC) 40 MG capsule Take 40 mg by mouth 2 (two) times daily.    . ondansetron (ZOFRAN) 4 MG tablet Take 4 mg by mouth every 8 (eight) hours as needed for nausea or vomiting.    Marland Kitchen. oxyCODONE (OXY IR/ROXICODONE) 5 MG immediate release tablet Take 1 tablet by mouth every 6 (six) hours as needed for pain.     . CHOLESTYRAMINE PO Take 4 g by mouth 2 (two) times daily.    . Diclofenac Sodium CR 100 MG 24 hr tablet Take 1 tablet by mouth daily.    . ferrous sulfate 325 (65 FE) MG tablet Take 325 mg by mouth daily with breakfast.    . meclizine (ANTIVERT) 12.5 MG tablet Take 12.5 mg by mouth 2 (two) times daily.    . metoCLOPramide (REGLAN) 10 MG tablet Take 1 tablet by mouth 2 (two) times daily.    . ranitidine (ZANTAC) 150 MG tablet Take 150 mg by mouth 2 (two) times daily.    . simvastatin (ZOCOR) 40  MG tablet Take 40 mg by mouth daily.    . sucralfate (CARAFATE) 1 g tablet Take 1 g by mouth 4 (four) times daily -  with meals and at bedtime.    Marland Kitchen. XIFAXAN 550 MG TABS tablet Take 1 tablet by mouth daily.     No current facility-administered medications for this visit.     Functional Status:  In your present state of health, do you have any difficulty performing the following activities: 02/07/2018 12/24/2017  Hearing? N N  Vision? N N  Difficulty concentrating or making decisions? N Y  Comment - "I do have a tendency to forget"  Walking or climbing stairs? Y Y  Dressing or bathing? Y Y  Comment - sometimes due to dizziness  Doing errands, shopping? N Y  Quarry managerreparing Food and eating ? - Y  Using the Toilet? - N  In the past six months, have you accidently leaked urine? - Y  Do you have problems with loss of bowel  control? - N  Managing your Medications? - Y  Comment - "sometimes I take my night medicines twice and sometimes I forget".  Managing your Finances? - N  Housekeeping or managing your Housekeeping? - Y    Fall/Depression Screening: Fall Risk  06/18/2018 03/11/2018 02/07/2018  Falls in the past year? 0 No No  Risk for fall due to : - - -  Risk for fall due to: Comment - - -   PHQ 2/9 Scores 02/07/2018 01/02/2018 12/17/2017  PHQ - 2 Score 3 3 1   PHQ- 9 Score - 15 -    Assessment: Patient will continue to benefit from health coach outreach for disease management and support.  [facility] CM Care Plan Problem One     Most Recent Value  [facility] Long Term Goal   IN 30 days the patient will verbalize lowering her 8.5 a1c by 1-2 points  [facility] Start Date  06/18/18  Interventions for Problem One Long Term Goal  Discussed diet, cbg reading, exercise and medication adherence.       Plan: RN Health Coach will contact patient in the month of January and patient agrees to next outreach. Send chair exercises, EMMI for food exchange.  [name] Fynlee Rowlands RN, BSN, [facility] RN Health Coach Disease Management [facility] Dial:  [phone]  Fax: [phone]

## 2018-07-18 ENCOUNTER — Other Ambulatory Visit: Payer: Self-pay

## 2018-07-18 NOTE — Patient Outreach (Signed)
[facility] [place]) Care Management  07/18/2018  Solita Kynard [birth year] [id number]    1st attempt to the patient for assessment. No answer.  HIPAA compliant voicemail left with contact information.  Plan: RN Health Coach will send letter. Rn Health Coach will make outreach attempt the patient within thirty days.  [name] RN, BSN, [facility] RN Health Coach Disease Management [facility] Dial:  [phone]  Fax: [phone]

## 2018-08-07 ENCOUNTER — Ambulatory Visit: Payer: Self-pay

## 2018-08-13 ENCOUNTER — Other Ambulatory Visit: Payer: Self-pay

## 2018-08-13 DIAGNOSIS — E559 Vitamin D deficiency, unspecified: Secondary | ICD-10-CM | POA: Diagnosis not present

## 2018-08-13 DIAGNOSIS — E7800 Pure hypercholesterolemia, unspecified: Secondary | ICD-10-CM | POA: Diagnosis not present

## 2018-08-13 DIAGNOSIS — R0602 Shortness of breath: Secondary | ICD-10-CM | POA: Diagnosis not present

## 2018-08-13 DIAGNOSIS — D539 Nutritional anemia, unspecified: Secondary | ICD-10-CM | POA: Diagnosis not present

## 2018-08-13 DIAGNOSIS — I10 Essential (primary) hypertension: Secondary | ICD-10-CM | POA: Diagnosis not present

## 2018-08-13 DIAGNOSIS — Z79899 Other long term (current) drug therapy: Secondary | ICD-10-CM | POA: Diagnosis not present

## 2018-08-13 DIAGNOSIS — Z0000 Encounter for general adult medical examination without abnormal findings: Secondary | ICD-10-CM | POA: Diagnosis not present

## 2018-08-13 DIAGNOSIS — E1165 Type 2 diabetes mellitus with hyperglycemia: Secondary | ICD-10-CM | POA: Diagnosis not present

## 2018-08-13 DIAGNOSIS — R5383 Other fatigue: Secondary | ICD-10-CM | POA: Diagnosis not present

## 2018-08-13 NOTE — Patient Outreach (Signed)
[facility]) Care Management  08/13/2018   Laniya Friedl 09-12-1955 [id number]  Subjective: Successful call to the patient. HIPAA verified.  The patient states that her blood sugar this morning was 254.  The blood sugars are ranging between 200-300.  Discussed with the patient how elevated blood sugars can start to affect her body.  She verbalized understanding. She states that she has not been eating correctly.  She has had problems with her teeth and had to have surgery.  She states that her gums were swollen and she was unable to eat hard foods, so she ate a lot of mac and cheese which she loves.  We discussed healthy soft foods options.  She states that she has gone and purchased some food that will be better for her.  She denies any falls.  She states that her stomach has start to hurt her again and the pain becomes more intense when she moves or walks. When we discussed her medication the patient states that she is not taking quite a few of her medications. She states that refills were not called in for her and she was unsure if she needed to continue them.  Encouraged the patient to follow up on the medications because her stomach could have pain due to her no longer taking the meds.  She states that she will make a follow up call.  She had an appointment today but did not go because she states that she was tired.  She is having problems sleeping at night and ends up staying up to 3 or 4 in the morning.  She had another appointment for this Friday and I encourage the patient to keep the appointment.  She states that she will.  Current Medications:  Current Outpatient Medications  Medication Sig Dispense Refill  . albuterol (PROVENTIL HFA;VENTOLIN HFA) 108 (90 Base) MCG/ACT inhaler Use 2 puffs every 4-6 hours as needed for Shortness of breath    . ALPRAZolam (XANAX) 1 MG tablet Take 1 mg by mouth 3 (three) times daily.    . budesonide-formoterol (SYMBICORT) 160-4.5 MCG/ACT  inhaler Inhale 2 puffs into the lungs daily.    Marland Kitchen glipiZIDE (GLUCOTROL) 5 MG tablet Take 5 mg by mouth daily before breakfast.    . hydrochlorothiazide (HYDRODIURIL) 25 MG tablet Take 25 mg by mouth daily.    Marland Kitchen JANUMET XR 50-1000 MG TB24 Use as directed in the mouth or throat once.    Marland Kitchen losartan (COZAAR) 25 MG tablet Take 25 mg by mouth daily.    . metFORMIN (GLUCOPHAGE) 1000 MG tablet Take 1,000 mg by mouth 2 (two) times daily with a meal.     . nystatin (MYCOSTATIN/NYSTOP) powder Apply topically daily.    Marland Kitchen omeprazole (PRILOSEC) 40 MG capsule Take 40 mg by mouth 2 (two) times daily.    . CHOLESTYRAMINE PO Take 4 g by mouth 2 (two) times daily.    . Diclofenac Sodium CR 100 MG 24 hr tablet Take 1 tablet by mouth daily.    Marland Kitchen dicyclomine (BENTYL) 10 MG capsule Take 1 capsule by mouth 4 (four) times daily.    . ferrous sulfate 325 (65 FE) MG tablet Take 325 mg by mouth daily with breakfast.    . fluticasone (FLONASE) 50 MCG/ACT nasal spray Place 2 sprays into both nostrils 2 (two) times daily.    . meclizine (ANTIVERT) 12.5 MG tablet Take 12.5 mg by mouth 2 (two) times daily.    . metoCLOPramide (REGLAN) 10 MG tablet  Take 1 tablet by mouth 2 (two) times daily.    . ondansetron (ZOFRAN) 4 MG tablet Take 4 mg by mouth every 8 (eight) hours as needed for nausea or vomiting.    Marland Kitchen oxyCODONE (OXY IR/ROXICODONE) 5 MG immediate release tablet Take 1 tablet by mouth every 6 (six) hours as needed for pain.     . ranitidine (ZANTAC) 150 MG tablet Take 150 mg by mouth 2 (two) times daily.    . simvastatin (ZOCOR) 40 MG tablet Take 40 mg by mouth daily.    . sucralfate (CARAFATE) 1 g tablet Take 1 g by mouth 4 (four) times daily -  with meals and at bedtime.    Marland Kitchen XIFAXAN 550 MG TABS tablet Take 1 tablet by mouth daily.     No current facility-administered medications for this visit.     Functional Status:  In your present state of health, do you have any difficulty performing the following activities:  02/07/2018 12/24/2017  Hearing? N N  Vision? N N  Difficulty concentrating or making decisions? N Y  Comment - "I do have a tendency to forget"  Walking or climbing stairs? Y Y  Dressing or bathing? Y Y  Comment - sometimes due to dizziness  Doing errands, shopping? N Y  Conservation officer, nature and eating ? - Y  Using the Toilet? - N  In the past six months, have you accidently leaked urine? - Y  Do you have problems with loss of bowel control? - N  Managing your Medications? - Y  Comment - "sometimes I take my night medicines twice and sometimes I forget".  Managing your Finances? - N  Housekeeping or managing your Housekeeping? - Y  Some recent data might be hidden    Fall/Depression Screening: Fall Risk  08/13/2018 06/18/2018 03/11/2018  Falls in the past year? 0 0 No  Risk for fall due to : - - -  Risk for fall due to: Comment - - -   PHQ 2/9 Scores 02/07/2018 01/02/2018 12/17/2017  PHQ - 2 Score 3 3 1   PHQ- 9 Score - 15 -    Assessment: Patient will continue to benefit from health coach outreach for disease management and support. [facility] CM Care Plan Problem One     Most Recent Value  [facility] Long Term Goal   IN 0 days the patient will verbalize lowering her 8.5 a1c by 1-2 points  [facility] Long Term Goal Start Date  08/13/18  Interventions for Problem One Long Term Goal  Dosscissed low carb diet, soft food choices, adherence withh er medications and encouraged the patient to keep her appointment on friday 2/29     Plan: [facility] will contact patient in the month of April and patient agrees to next outreach. RN Health Coach will mail the patient chair exercises and handout all bout the carbs.   Lazaro Arms RN, BSN, [place] Direct Dial:  [phone]  Fax: [phone]

## 2018-08-13 NOTE — Patient Outreach (Signed)
[facility]) Care Management  08/13/2018  Brinnley Constantine 1955/10/12 [id number]    2nd attempt to outreach the patient. No answer.  HIPAA compliant voicemail left with contact information.  Plan: RN Health Coach will make outreach attempt to the patient within thirty days.  [name] RN, BSN, [facility] RN Health Coach Disease Management [facility] Dial:  [phone]  Fax: [phone]

## 2018-08-16 DIAGNOSIS — E7800 Pure hypercholesterolemia, unspecified: Secondary | ICD-10-CM | POA: Diagnosis not present

## 2018-08-16 DIAGNOSIS — D539 Nutritional anemia, unspecified: Secondary | ICD-10-CM | POA: Diagnosis not present

## 2018-08-16 DIAGNOSIS — E559 Vitamin D deficiency, unspecified: Secondary | ICD-10-CM | POA: Diagnosis not present

## 2018-08-16 DIAGNOSIS — R0602 Shortness of breath: Secondary | ICD-10-CM | POA: Diagnosis not present

## 2018-08-16 DIAGNOSIS — R8290 Unspecified abnormal findings in urine: Secondary | ICD-10-CM | POA: Diagnosis not present

## 2018-08-16 DIAGNOSIS — E1165 Type 2 diabetes mellitus with hyperglycemia: Secondary | ICD-10-CM | POA: Diagnosis not present

## 2018-08-16 DIAGNOSIS — R5383 Other fatigue: Secondary | ICD-10-CM | POA: Diagnosis not present

## 2018-08-21 DIAGNOSIS — E1165 Type 2 diabetes mellitus with hyperglycemia: Secondary | ICD-10-CM | POA: Diagnosis not present

## 2018-08-21 DIAGNOSIS — R52 Pain, unspecified: Secondary | ICD-10-CM | POA: Diagnosis not present

## 2018-08-21 DIAGNOSIS — R05 Cough: Secondary | ICD-10-CM | POA: Diagnosis not present

## 2018-08-21 DIAGNOSIS — J209 Acute bronchitis, unspecified: Secondary | ICD-10-CM | POA: Diagnosis not present

## 2018-08-23 ENCOUNTER — Other Ambulatory Visit: Payer: Self-pay

## 2018-08-23 NOTE — Patient Outreach (Addendum)
[facility]) Care Management  08/23/2018  Diane Gonzales Dec 24, 1955 [id number]   Subjective: Received a call from the patient.  HIPAA verified.  The patient states that she was seen by Dr. Massie Maroon on 3/4.  She presented with a fever of 102 cough and shortness of breath.  She states that she was tested and did not have the flu she has a URI.  She states that she was given Augmentin , Ipratropium, and cough medication to take.  She states that she is in the home and has taken Ibuprofen and her fever has come down to 100.8. Advised the patient to stay in drink fluid take medications as prescribed and stay in the home to recuperate. She verbalized understanding. She states that her daughter is coming home tonight and will be able to go out and get things that she needs.  She also states that she was started on Tresiba  and takes 18 units daily.   Plan: RN Health Coach will outreach the patient at the next scheduled interval.  [name] RN, BSN, [facility] RN Health Coach Disease Management [facility] Direct Dial:  [phone]  Fax: [phone]

## 2018-08-28 DIAGNOSIS — E1165 Type 2 diabetes mellitus with hyperglycemia: Secondary | ICD-10-CM | POA: Diagnosis not present

## 2018-08-28 DIAGNOSIS — R05 Cough: Secondary | ICD-10-CM | POA: Diagnosis not present

## 2018-08-28 DIAGNOSIS — J40 Bronchitis, not specified as acute or chronic: Secondary | ICD-10-CM | POA: Diagnosis not present

## 2018-08-28 DIAGNOSIS — E7800 Pure hypercholesterolemia, unspecified: Secondary | ICD-10-CM | POA: Diagnosis not present

## 2018-09-06 ENCOUNTER — Other Ambulatory Visit: Payer: Self-pay

## 2018-09-06 NOTE — Patient Outreach (Signed)
[facility]) Care Management  09/06/2018  Oneisha Ozier April 30, 1956 [id number]    1st unsuccessful outreach to the patient for assessment. No answer.  HIPAA compliant voicemail left with contact information.  Plan: RN Health Coach will send letter. Rn Health Coach will make outreach attempt to the patient within a month.  [name] RN, BSN, [facility] RN Health Coach Disease Management [facility] Dial:  [phone]  Fax: [phone]

## 2018-09-11 DIAGNOSIS — E1165 Type 2 diabetes mellitus with hyperglycemia: Secondary | ICD-10-CM | POA: Diagnosis not present

## 2018-09-11 DIAGNOSIS — J40 Bronchitis, not specified as acute or chronic: Secondary | ICD-10-CM | POA: Diagnosis not present

## 2018-09-11 DIAGNOSIS — Z794 Long term (current) use of insulin: Secondary | ICD-10-CM | POA: Diagnosis not present

## 2018-09-11 DIAGNOSIS — R05 Cough: Secondary | ICD-10-CM | POA: Diagnosis not present

## 2018-09-16 ENCOUNTER — Ambulatory Visit: Payer: Self-pay

## 2018-09-18 DIAGNOSIS — R509 Fever, unspecified: Secondary | ICD-10-CM | POA: Diagnosis not present

## 2018-09-18 DIAGNOSIS — J40 Bronchitis, not specified as acute or chronic: Secondary | ICD-10-CM | POA: Diagnosis not present

## 2018-09-18 DIAGNOSIS — E1165 Type 2 diabetes mellitus with hyperglycemia: Secondary | ICD-10-CM | POA: Diagnosis not present

## 2018-09-18 DIAGNOSIS — Z794 Long term (current) use of insulin: Secondary | ICD-10-CM | POA: Diagnosis not present

## 2018-09-18 DIAGNOSIS — R05 Cough: Secondary | ICD-10-CM | POA: Diagnosis not present

## 2018-10-11 ENCOUNTER — Other Ambulatory Visit: Payer: Self-pay

## 2018-10-11 NOTE — Patient Outreach (Signed)
[facility]) Care Management  10/11/2018  Diane Gonzales 10-Jan-1956 [id number]    2nd attempt to outreach the patient for assessment.  No answer.  HIPAA compliant voicemail left with contact information.  Plan: RN Health Coach will make outreach attempt to the patient within thirty business days.  [name] RN, BSN, [facility] RN Health Coach Disease Management [facility] Dial:  [phone]  Fax: [phone]

## 2018-10-15 ENCOUNTER — Other Ambulatory Visit: Payer: Self-pay

## 2018-10-15 NOTE — Patient Outreach (Addendum)
[facility]) Care Management  10/15/2018   Starling MannsDolores Gonzales 03/02/1956 [id number]  Subjective: Successful outreach.  HIPAA verified by the patient. The patient states that she is doing fair.  She states that she has had anxiety about the corona virus. She states that she has stopped watching TV.  Her anxiety is getting better.  She states that she has some insomnia and not sure why.  Advised the patient to talk with her physician.  She verbalized understanding.  She states that she checked her blood sugar this morning and it was 157 fasting at 916 am.  She drank coffee with creamer and a pack of crackers and at 1215 pm her blood sugar was 210.  She states that she is taking her medications as prescribed.  After reviewing her medications she found a bottle of Janumet in her drawer and is unsure if she is suppose to be taking this medication.  The patient states she has an appointment with Dr. Massie MaroonMcCoy tomorrow and will follow up on the medication.  The patient states that she has not had that much of an appetite.  She feels that because she drinks a lot of water and eats ice chips that it feels her up.  Discussed heathy food choices and putting protein in her diet.  She verbalized understanding.    Current Medications:  Current Outpatient Medications  Medication Sig Dispense Refill  . albuterol (PROVENTIL HFA;VENTOLIN HFA) 108 (90 Base) MCG/ACT inhaler Use 2 puffs every 4-6 hours as needed for Shortness of breath    . ALPRAZolam (XANAX) 1 MG tablet Take 1 mg by mouth 3 (three) times daily.    Marland Kitchen. aspirin EC 81 MG tablet Take 81 mg by mouth daily.    Marland Kitchen. dicyclomine (BENTYL) 10 MG capsule Take 1 capsule by mouth 4 (four) times daily.    . ferrous sulfate 325 (65 FE) MG tablet Take 325 mg by mouth daily with breakfast.    . fluticasone (FLONASE) 50 MCG/ACT nasal spray Place 2 sprays into both nostrils 2 (two) times daily.    Marland Kitchen. glipiZIDE (GLUCOTROL) 5 MG tablet Take 5 mg by mouth daily  before breakfast.    . hydrochlorothiazide (HYDRODIURIL) 25 MG tablet Take 25 mg by mouth daily.    . Insulin Degludec (TRESIBA) 100 UNIT/ML SOLN Inject 18 Units/oz/day into the skin.    Marland Kitchen. ipratropium-albuterol (DUONEB) 0.5-2.5 (3) MG/3ML SOLN Take 3 mLs by nebulization 3 (three) times daily.    Marland Kitchen. losartan (COZAAR) 25 MG tablet Take 25 mg by mouth daily.    . metFORMIN (GLUCOPHAGE) 1000 MG tablet Take 1,000 mg by mouth 2 (two) times daily with a meal.     . nystatin (MYCOSTATIN/NYSTOP) powder Apply topically daily.    Marland Kitchen. omeprazole (PRILOSEC) 40 MG capsule Take 40 mg by mouth 2 (two) times daily.    . ondansetron (ZOFRAN) 4 MG tablet Take 4 mg by mouth every 8 (eight) hours as needed for nausea or vomiting.    . simvastatin (ZOCOR) 40 MG tablet Take 40 mg by mouth daily.    . budesonide-formoterol (SYMBICORT) 160-4.5 MCG/ACT inhaler Inhale 2 puffs into the lungs daily.    . CHOLESTYRAMINE PO Take 4 g by mouth 2 (two) times daily.    . Diclofenac Sodium CR 100 MG 24 hr tablet Take 1 tablet by mouth daily.    Marland Kitchen. JANUMET XR 50-1000 MG TB24 Use as directed in the mouth or throat once.    . meclizine (ANTIVERT) 12.5  MG tablet Take 12.5 mg by mouth 2 (two) times daily.    . metoCLOPramide (REGLAN) 10 MG tablet Take 1 tablet by mouth 2 (two) times daily.    Marland Kitchen oxyCODONE (OXY IR/ROXICODONE) 5 MG immediate release tablet Take 1 tablet by mouth every 6 (six) hours as needed for pain.     . ranitidine (ZANTAC) 150 MG tablet Take 150 mg by mouth 2 (two) times daily.    . sucralfate (CARAFATE) 1 g tablet Take 1 g by mouth 4 (four) times daily -  with meals and at bedtime.    Marland Kitchen XIFAXAN 550 MG TABS tablet Take 1 tablet by mouth daily.     No current facility-administered medications for this visit.     Functional Status:  In your present state of health, do you have any difficulty performing the following activities: 02/07/2018 12/24/2017  Hearing? N N  Vision? N N  Difficulty concentrating or making  decisions? N Y  Comment - "I do have a tendency to forget"  Walking or climbing stairs? Y Y  Dressing or bathing? Y Y  Comment - sometimes due to dizziness  Doing errands, shopping? N Y  Quarry manager and eating ? - Y  Using the Toilet? - N  In the past six months, have you accidently leaked urine? - Y  Do you have problems with loss of bowel control? - N  Managing your Medications? - Y  Comment - "sometimes I take my night medicines twice and sometimes I forget".  Managing your Finances? - N  Housekeeping or managing your Housekeeping? - Y  Some recent data might be hidden    Fall/Depression Screening: Fall Risk  08/13/2018 06/18/2018 03/11/2018  Falls in the past year? 0 0 No  Risk for fall due to : - - -  Risk for fall due to: Comment - - -   PHQ 2/9 Scores 02/07/2018 01/02/2018 12/17/2017  PHQ - 2 Score 3 3 1   PHQ- 9 Score - 15 -    Assessment: Patient will continue to benefit from health coach outreach for disease management and support. [facility] CM Care Plan Problem One     Most Recent Value  [facility] Long Term Goal   IN 90 days the patient will verbalize lowering her 8.5 a1c by 1-2 points  [facility] Long Term Goal Start Date  10/15/18  Interventions for Problem One Long Term Goal  Reviewed cbg record, discussed food intake and importance of eating meals and protein, reviewed medications and encouraged medication management, advised the patient to write down question for her doctor's visit, and encouraged  exercise        Plan: RN Health Coach will contact patient in the month of July and patient agrees to next outreach.   [name] RN, BSN, [facility] RN Health Coach Disease Management [facility] Dial:  [phone]  Fax: [phone]

## 2018-10-16 DIAGNOSIS — F419 Anxiety disorder, unspecified: Secondary | ICD-10-CM | POA: Diagnosis not present

## 2018-10-16 DIAGNOSIS — E1165 Type 2 diabetes mellitus with hyperglycemia: Secondary | ICD-10-CM | POA: Diagnosis not present

## 2018-10-16 DIAGNOSIS — I10 Essential (primary) hypertension: Secondary | ICD-10-CM | POA: Diagnosis not present

## 2018-10-16 DIAGNOSIS — Z794 Long term (current) use of insulin: Secondary | ICD-10-CM | POA: Diagnosis not present

## 2018-10-24 DIAGNOSIS — F4312 Post-traumatic stress disorder, chronic: Secondary | ICD-10-CM | POA: Diagnosis not present

## 2018-10-24 DIAGNOSIS — E1165 Type 2 diabetes mellitus with hyperglycemia: Secondary | ICD-10-CM | POA: Diagnosis not present

## 2018-10-24 DIAGNOSIS — Z794 Long term (current) use of insulin: Secondary | ICD-10-CM | POA: Diagnosis not present

## 2018-10-24 DIAGNOSIS — F419 Anxiety disorder, unspecified: Secondary | ICD-10-CM | POA: Diagnosis not present

## 2018-11-05 ENCOUNTER — Ambulatory Visit: Payer: Self-pay

## 2018-11-14 DIAGNOSIS — E1165 Type 2 diabetes mellitus with hyperglycemia: Secondary | ICD-10-CM | POA: Diagnosis not present

## 2018-11-14 DIAGNOSIS — Z9189 Other specified personal risk factors, not elsewhere classified: Secondary | ICD-10-CM | POA: Diagnosis not present

## 2018-11-14 DIAGNOSIS — F419 Anxiety disorder, unspecified: Secondary | ICD-10-CM | POA: Diagnosis not present

## 2018-11-14 DIAGNOSIS — I10 Essential (primary) hypertension: Secondary | ICD-10-CM | POA: Diagnosis not present

## 2018-11-21 DIAGNOSIS — F419 Anxiety disorder, unspecified: Secondary | ICD-10-CM | POA: Diagnosis not present

## 2018-11-27 ENCOUNTER — Encounter: Payer: PPO | Attending: Nurse Practitioner | Admitting: *Deleted

## 2018-11-27 DIAGNOSIS — E785 Hyperlipidemia, unspecified: Secondary | ICD-10-CM | POA: Insufficient documentation

## 2018-11-27 DIAGNOSIS — E1169 Type 2 diabetes mellitus with other specified complication: Secondary | ICD-10-CM | POA: Insufficient documentation

## 2018-11-27 NOTE — Patient Instructions (Signed)
Plan:  Aim for 1-2 Carb Choices per meal and snack if hungry (15-30 grams)  Include protein in moderation with your meals and snacks Continue reading food labels for Total Carbohydrate of foods Continue increasing your activity level by doing some arm chair exercises for 5-10 minutes daily as tolerated Consider checking BG at alternate times per day  Continue taking medication as directed by MD

## 2018-11-27 NOTE — Progress Notes (Signed)
Diabetes Self-Management Education  Visit Type: First/Initial  Appt. Start Time: 1115 Appt. End Time: 61607  11/27/2018  This visit was completed via telephone due to the COVID-19 pandemic.   I spoke with Diane Gonzales and verified that I was speaking with the correct person with two patient identifiers (full name and date of birth).   I discussed the limitations related to this kind of visit and the patient is willing to proceed.  [name], identified by name and date of birth, is a 63 y.o. female with a diagnosis of Diabetes: Type 2. Patient states on the phone that she is most interested in learning what foods she should eat to help control her blood sugars better. She cannot stand long due to back pain, so she cannot cook many foods. She states she does do some arm chair exercises as tolerated and she has the Freestyle Libre CGM to test her blood sugars several times a day. She states she has insomnia and may sleep from 5 AM to 11 AM give or take. She is taking [place] insulin @ 20 units per day and is also taking Glipizide.  ASSESSMENT  There were no vitals taken for this visit. There is no height or weight on file to calculate BMI.  Diabetes Self-Management Education - 11/27/18 1649      Visit Information   Visit Type  First/Initial      Initial Visit   Diabetes Type  Type 2    Are you currently following a meal plan?  No    Are you taking your medications as prescribed?  Yes      Psychosocial Assessment   Patient Belief/Attitude about Diabetes  --   frustrated and not sure what to do   Self-care barriers  Debilitated state due to current medical condition    Other persons present  Patient    Patient Concerns  Nutrition/Meal planning    Learning Readiness  Contemplating      Pre-Education Assessment   Patient understands the diabetes disease and treatment process.  Needs Instruction    Patient understands incorporating nutritional management into  lifestyle.  Needs Instruction    Patient undertands incorporating physical activity into lifestyle.  Needs Instruction    Patient understands using medications safely.  Needs Instruction    Patient understands monitoring blood glucose, interpreting and using results  Needs Instruction    Patient understands prevention, detection, and treatment of acute complications.  Needs Instruction    Patient understands prevention, detection, and treatment of chronic complications.  Needs Instruction    Patient understands how to develop strategies to address psychosocial issues.  Needs Instruction    Patient understands how to develop strategies to promote health/change behavior.  Needs Instruction      Complications   Last HgB A1C per patient/outside source  8.5 %    How often do you check your blood sugar?  > 4 times/day   she has Freestyle Libre CGM   Fasting Blood glucose range (mg/dL)  >200    Postprandial Blood glucose range (mg/dL)  >200      Dietary Intake   Breakfast  toast or crackers around 11 AM or later    Snack (morning)  maybe 1 PM - sandwich OR lean cuisine frozen meal OR     Lunch  or may wait unitl 3-4 PM for same type of meal. also enjoys fish like tilapia    Dinner  some type of fish by itself due to history  gastric bypass surgery OR may have roasted vegetables with potatoes as a meal    Beverage(s)  coffee, water with lots of ice, occasionally glucerna or sparking water      Exercise   Exercise Type  ADL's   cant stand long due to back pain, she does do some arm chair exercises at home     Patient Education   Nutrition management   Role of diet in the treatment of diabetes and the relationship between the three main macronutrients and blood glucose level;Food label reading, portion sizes and measuring food.;Carbohydrate counting;Reviewed blood glucose goals for pre and post meals and how to evaluate the patients' food intake on their blood glucose level.    Physical activity  and exercise   Helped patient identify appropriate exercises in relation to his/her diabetes, diabetes complications and other health issue.      Individualized Goals (developed by patient)   Nutrition  General guidelines for healthy choices and portions discussed    Physical Activity  15 minutes per day    Medications  take my medication as prescribed    Monitoring   test blood glucose pre and post meals as discussed      Post-Education Assessment   Patient understands incorporating nutritional management into lifestyle.  Demonstrates understanding / competency    Patient undertands incorporating physical activity into lifestyle.  Demonstrates understanding / competency    Patient understands monitoring blood glucose, interpreting and using results  Demonstrates understanding / competency      Outcomes   Expected Outcomes  Demonstrated interest in learning. Expect positive outcomes    Future DMSE  4-6 wks    Program Status  Not Completed       Individualized Plan for Diabetes Self-Management Training:   Learning Objective:  Patient will have a greater understanding of diabetes self-management. Patient education plan is to attend individual and/or group sessions per assessed needs and concerns.   Plan:   Patient Instructions  Plan:  Aim for 1-2 Carb Choices per meal and snack if hungry (15-30 grams)  Include protein in moderation with your meals and snacks Continue reading food labels for Total Carbohydrate of foods Continue increasing your activity level by doing some arm chair exercises for 5-10 minutes daily as tolerated Consider checking BG at alternate times per day  Continue taking medication as directed by MD  Expected Outcomes:  Demonstrated interest in learning. Expect positive outcomes  Education material mailed: ADA - How to Thrive: A Guide for Your Journey with Diabetes, Meal plan card and Carbohydrate counting sheet  If problems or questions, patient to contact  team via:  Phone  Future DSME appointment: 4-6 wks. Patient to call for follow up visit after she receives the written material and has a chance to look over it.

## 2018-12-09 ENCOUNTER — Other Ambulatory Visit: Payer: Self-pay

## 2018-12-09 NOTE — Patient Outreach (Signed)
[facility]) Care Management  12/09/2018  Diane Gonzales Feb 26, 1956 [id number]    RN Health Coach closing the program.  Patient is transitioning to external program [facility] for continued case management.  Lazaro Arms RN, BSN, [place] Direct Dial:  [phone]  Fax: [phone]

## 2018-12-27 ENCOUNTER — Ambulatory Visit: Payer: PPO

## 2019-01-07 DIAGNOSIS — E7800 Pure hypercholesterolemia, unspecified: Secondary | ICD-10-CM | POA: Diagnosis not present

## 2019-01-07 DIAGNOSIS — E1165 Type 2 diabetes mellitus with hyperglycemia: Secondary | ICD-10-CM | POA: Diagnosis not present

## 2019-01-07 DIAGNOSIS — D539 Nutritional anemia, unspecified: Secondary | ICD-10-CM | POA: Diagnosis not present

## 2019-01-07 DIAGNOSIS — F419 Anxiety disorder, unspecified: Secondary | ICD-10-CM | POA: Diagnosis not present

## 2019-01-07 DIAGNOSIS — I10 Essential (primary) hypertension: Secondary | ICD-10-CM | POA: Diagnosis not present

## 2019-01-07 DIAGNOSIS — E559 Vitamin D deficiency, unspecified: Secondary | ICD-10-CM | POA: Diagnosis not present

## 2019-01-07 DIAGNOSIS — Z794 Long term (current) use of insulin: Secondary | ICD-10-CM | POA: Diagnosis not present

## 2019-01-07 DIAGNOSIS — Z1159 Encounter for screening for other viral diseases: Secondary | ICD-10-CM | POA: Diagnosis not present

## 2019-01-07 DIAGNOSIS — R5383 Other fatigue: Secondary | ICD-10-CM | POA: Diagnosis not present

## 2019-01-07 DIAGNOSIS — Z79899 Other long term (current) drug therapy: Secondary | ICD-10-CM | POA: Diagnosis not present

## 2019-02-06 DIAGNOSIS — I10 Essential (primary) hypertension: Secondary | ICD-10-CM | POA: Diagnosis not present

## 2019-02-06 DIAGNOSIS — F419 Anxiety disorder, unspecified: Secondary | ICD-10-CM | POA: Diagnosis not present

## 2019-02-06 DIAGNOSIS — Z794 Long term (current) use of insulin: Secondary | ICD-10-CM | POA: Diagnosis not present

## 2019-02-06 DIAGNOSIS — Z1159 Encounter for screening for other viral diseases: Secondary | ICD-10-CM | POA: Diagnosis not present

## 2019-02-06 DIAGNOSIS — Z79899 Other long term (current) drug therapy: Secondary | ICD-10-CM | POA: Diagnosis not present

## 2019-02-06 DIAGNOSIS — E1165 Type 2 diabetes mellitus with hyperglycemia: Secondary | ICD-10-CM | POA: Diagnosis not present

## 2019-02-12 ENCOUNTER — Telehealth: Payer: Self-pay

## 2019-02-12 NOTE — Telephone Encounter (Signed)
This pt needs to be changed from Doxy to in person so she can have bloodwork during appt please.

## 2019-02-13 NOTE — Telephone Encounter (Signed)
Called and left voicemail for pt to return call. 

## 2019-02-13 NOTE — Telephone Encounter (Signed)
Please ask her if she has had an A1c with a PCP in the last 6 months.  Also if she is using the freestyle libre sensor is she able to upload this.  If not then she will have to delay her appointment or see if another MD here will see her

## 2019-02-13 NOTE — Telephone Encounter (Signed)
Still okay to do virtual visit with no recent A1c?

## 2019-02-13 NOTE — Telephone Encounter (Signed)
Patient states during scheduling she is not comfortable coming in for her visit in person.

## 2019-02-17 ENCOUNTER — Ambulatory Visit: Payer: PPO | Admitting: Endocrinology

## 2019-02-17 ENCOUNTER — Other Ambulatory Visit: Payer: Self-pay

## 2019-02-17 NOTE — Telephone Encounter (Signed)
LVM to return our call.

## 2019-03-17 ENCOUNTER — Ambulatory Visit: Payer: PPO | Admitting: Endocrinology

## 2019-03-17 ENCOUNTER — Other Ambulatory Visit: Payer: Self-pay | Admitting: *Deleted

## 2019-03-17 DIAGNOSIS — I10 Essential (primary) hypertension: Secondary | ICD-10-CM | POA: Diagnosis not present

## 2019-03-17 DIAGNOSIS — F419 Anxiety disorder, unspecified: Secondary | ICD-10-CM | POA: Diagnosis not present

## 2019-03-17 DIAGNOSIS — E1165 Type 2 diabetes mellitus with hyperglycemia: Secondary | ICD-10-CM | POA: Diagnosis not present

## 2019-03-17 DIAGNOSIS — Z794 Long term (current) use of insulin: Secondary | ICD-10-CM | POA: Diagnosis not present

## 2019-03-17 DIAGNOSIS — Z20822 Contact with and (suspected) exposure to covid-19: Secondary | ICD-10-CM

## 2019-03-17 DIAGNOSIS — Z03818 Encounter for observation for suspected exposure to other biological agents ruled out: Secondary | ICD-10-CM | POA: Diagnosis not present

## 2019-03-19 LAB — NOVEL CORONAVIRUS, NAA: SARS-CoV-2, NAA: NOT DETECTED

## 2019-03-26 DIAGNOSIS — K760 Fatty (change of) liver, not elsewhere classified: Secondary | ICD-10-CM | POA: Diagnosis not present

## 2019-03-26 DIAGNOSIS — R1084 Generalized abdominal pain: Secondary | ICD-10-CM | POA: Diagnosis not present

## 2019-03-27 ENCOUNTER — Other Ambulatory Visit: Payer: Self-pay | Admitting: Gastroenterology

## 2019-03-27 ENCOUNTER — Ambulatory Visit
Admission: RE | Admit: 2019-03-27 | Discharge: 2019-03-27 | Disposition: A | Discharge status: Home / Self Care | Payer: PPO | Source: Ambulatory Visit | Attending: Gastroenterology | Admitting: Gastroenterology

## 2019-03-27 DIAGNOSIS — K5730 Diverticulosis of large intestine without perforation or abscess without bleeding: Secondary | ICD-10-CM | POA: Diagnosis not present

## 2019-03-27 DIAGNOSIS — R1084 Generalized abdominal pain: Secondary | ICD-10-CM

## 2019-04-01 ENCOUNTER — Encounter: Payer: Self-pay | Admitting: Endocrinology

## 2019-04-01 ENCOUNTER — Other Ambulatory Visit: Payer: Self-pay

## 2019-04-01 ENCOUNTER — Encounter: Payer: PPO | Admitting: Endocrinology

## 2019-04-02 NOTE — Progress Notes (Signed)
This encounter was created in error - please disregard.

## 2019-04-09 DIAGNOSIS — R6889 Other general symptoms and signs: Secondary | ICD-10-CM | POA: Diagnosis not present

## 2019-04-09 DIAGNOSIS — K760 Fatty (change of) liver, not elsewhere classified: Secondary | ICD-10-CM | POA: Diagnosis not present

## 2019-04-09 DIAGNOSIS — Z7189 Other specified counseling: Secondary | ICD-10-CM | POA: Diagnosis not present

## 2019-04-10 DIAGNOSIS — Z20828 Contact with and (suspected) exposure to other viral communicable diseases: Secondary | ICD-10-CM | POA: Diagnosis not present

## 2019-04-10 DIAGNOSIS — R509 Fever, unspecified: Secondary | ICD-10-CM | POA: Diagnosis not present

## 2019-04-10 DIAGNOSIS — R0600 Dyspnea, unspecified: Secondary | ICD-10-CM | POA: Diagnosis not present

## 2019-05-09 DIAGNOSIS — E559 Vitamin D deficiency, unspecified: Secondary | ICD-10-CM | POA: Diagnosis not present

## 2019-05-09 DIAGNOSIS — Z794 Long term (current) use of insulin: Secondary | ICD-10-CM | POA: Diagnosis not present

## 2019-05-09 DIAGNOSIS — I10 Essential (primary) hypertension: Secondary | ICD-10-CM | POA: Diagnosis not present

## 2019-05-09 DIAGNOSIS — Z79899 Other long term (current) drug therapy: Secondary | ICD-10-CM | POA: Diagnosis not present

## 2019-05-09 DIAGNOSIS — Z03818 Encounter for observation for suspected exposure to other biological agents ruled out: Secondary | ICD-10-CM | POA: Diagnosis not present

## 2019-05-09 DIAGNOSIS — E7800 Pure hypercholesterolemia, unspecified: Secondary | ICD-10-CM | POA: Diagnosis not present

## 2019-05-09 DIAGNOSIS — E1165 Type 2 diabetes mellitus with hyperglycemia: Secondary | ICD-10-CM | POA: Diagnosis not present

## 2019-05-09 DIAGNOSIS — R5383 Other fatigue: Secondary | ICD-10-CM | POA: Diagnosis not present

## 2019-05-13 ENCOUNTER — Other Ambulatory Visit: Payer: PPO

## 2019-05-29 DIAGNOSIS — M79605 Pain in left leg: Secondary | ICD-10-CM | POA: Diagnosis not present

## 2019-05-29 DIAGNOSIS — M79604 Pain in right leg: Secondary | ICD-10-CM | POA: Diagnosis not present

## 2019-05-29 DIAGNOSIS — R7989 Other specified abnormal findings of blood chemistry: Secondary | ICD-10-CM | POA: Diagnosis not present

## 2019-05-29 DIAGNOSIS — R42 Dizziness and giddiness: Secondary | ICD-10-CM | POA: Diagnosis not present

## 2019-07-03 ENCOUNTER — Other Ambulatory Visit (HOSPITAL_COMMUNITY): Payer: Self-pay | Admitting: Gastroenterology

## 2019-07-03 ENCOUNTER — Other Ambulatory Visit: Payer: Self-pay | Admitting: Gastroenterology

## 2019-07-03 DIAGNOSIS — K760 Fatty (change of) liver, not elsewhere classified: Secondary | ICD-10-CM

## 2019-07-08 ENCOUNTER — Ambulatory Visit: Payer: PPO | Admitting: Internal Medicine

## 2019-07-09 ENCOUNTER — Ambulatory Visit (HOSPITAL_COMMUNITY): Payer: PPO

## 2019-07-11 ENCOUNTER — Other Ambulatory Visit: Payer: Self-pay | Admitting: Radiology

## 2019-07-14 ENCOUNTER — Encounter (HOSPITAL_COMMUNITY): Payer: Self-pay

## 2019-07-14 ENCOUNTER — Ambulatory Visit (HOSPITAL_COMMUNITY)
Admission: RE | Admit: 2019-07-14 | Discharge: 2019-07-14 | Disposition: A | Discharge status: Home / Self Care | Payer: HMO | Source: Ambulatory Visit | Attending: Gastroenterology | Admitting: Gastroenterology

## 2019-07-14 ENCOUNTER — Other Ambulatory Visit: Payer: Self-pay

## 2019-07-14 DIAGNOSIS — Z888 Allergy status to other drugs, medicaments and biological substances status: Secondary | ICD-10-CM | POA: Diagnosis not present

## 2019-07-14 DIAGNOSIS — Z794 Long term (current) use of insulin: Secondary | ICD-10-CM | POA: Insufficient documentation

## 2019-07-14 DIAGNOSIS — K7689 Other specified diseases of liver: Secondary | ICD-10-CM | POA: Diagnosis not present

## 2019-07-14 DIAGNOSIS — Z9049 Acquired absence of other specified parts of digestive tract: Secondary | ICD-10-CM | POA: Insufficient documentation

## 2019-07-14 DIAGNOSIS — Z90710 Acquired absence of both cervix and uterus: Secondary | ICD-10-CM | POA: Insufficient documentation

## 2019-07-14 DIAGNOSIS — E1122 Type 2 diabetes mellitus with diabetic chronic kidney disease: Secondary | ICD-10-CM | POA: Insufficient documentation

## 2019-07-14 DIAGNOSIS — N189 Chronic kidney disease, unspecified: Secondary | ICD-10-CM | POA: Diagnosis not present

## 2019-07-14 DIAGNOSIS — Z8249 Family history of ischemic heart disease and other diseases of the circulatory system: Secondary | ICD-10-CM | POA: Diagnosis not present

## 2019-07-14 DIAGNOSIS — Z809 Family history of malignant neoplasm, unspecified: Secondary | ICD-10-CM | POA: Diagnosis not present

## 2019-07-14 DIAGNOSIS — Z886 Allergy status to analgesic agent status: Secondary | ICD-10-CM | POA: Insufficient documentation

## 2019-07-14 DIAGNOSIS — Z833 Family history of diabetes mellitus: Secondary | ICD-10-CM | POA: Diagnosis not present

## 2019-07-14 DIAGNOSIS — R109 Unspecified abdominal pain: Secondary | ICD-10-CM | POA: Insufficient documentation

## 2019-07-14 DIAGNOSIS — I129 Hypertensive chronic kidney disease with stage 1 through stage 4 chronic kidney disease, or unspecified chronic kidney disease: Secondary | ICD-10-CM | POA: Diagnosis not present

## 2019-07-14 DIAGNOSIS — Z7982 Long term (current) use of aspirin: Secondary | ICD-10-CM | POA: Insufficient documentation

## 2019-07-14 DIAGNOSIS — Z885 Allergy status to narcotic agent status: Secondary | ICD-10-CM | POA: Diagnosis not present

## 2019-07-14 DIAGNOSIS — Z91041 Radiographic dye allergy status: Secondary | ICD-10-CM | POA: Diagnosis not present

## 2019-07-14 DIAGNOSIS — Z791 Long term (current) use of non-steroidal anti-inflammatories (NSAID): Secondary | ICD-10-CM | POA: Insufficient documentation

## 2019-07-14 DIAGNOSIS — K760 Fatty (change of) liver, not elsewhere classified: Secondary | ICD-10-CM

## 2019-07-14 DIAGNOSIS — Z79899 Other long term (current) drug therapy: Secondary | ICD-10-CM | POA: Insufficient documentation

## 2019-07-14 LAB — APTT: aPTT: 32 seconds (ref 24–36)

## 2019-07-14 LAB — CBC
HCT: 33.7 % — ABNORMAL LOW (ref 36.0–46.0)
Hemoglobin: 10.5 g/dL — ABNORMAL LOW (ref 12.0–15.0)
MCH: 25.4 pg — ABNORMAL LOW (ref 26.0–34.0)
MCHC: 31.2 g/dL (ref 30.0–36.0)
MCV: 81.4 fL (ref 80.0–100.0)
Platelets: 220 10*3/uL (ref 150–400)
RBC: 4.14 MIL/uL (ref 3.87–5.11)
RDW: 15.4 % (ref 11.5–15.5)
WBC: 10.5 10*3/uL (ref 4.0–10.5)
nRBC: 0 % (ref 0.0–0.2)

## 2019-07-14 LAB — PROTIME-INR
INR: 1.1 (ref 0.8–1.2)
Prothrombin Time: 13.8 seconds (ref 11.4–15.2)

## 2019-07-14 LAB — GLUCOSE, CAPILLARY
Glucose-Capillary: 382 mg/dL — ABNORMAL HIGH (ref 70–99)
Glucose-Capillary: 321 mg/dL — ABNORMAL HIGH (ref 70–99)

## 2019-07-14 MED ORDER — MIDAZOLAM HCL 2 MG/2ML IJ SOLN
INTRAMUSCULAR | Status: AC | PRN
  Administered 2019-07-14: 1 mg via INTRAVENOUS

## 2019-07-14 MED ORDER — GELATIN ABSORBABLE 12-7 MM EX MISC
CUTANEOUS | Status: AC
  Filled 2019-07-14: qty 1

## 2019-07-14 MED ORDER — SODIUM CHLORIDE 0.9 % IV SOLN: INTRAVENOUS | Status: DC

## 2019-07-14 MED ORDER — FENTANYL CITRATE (PF) 100 MCG/2ML IJ SOLN
INTRAMUSCULAR | Status: AC
  Filled 2019-07-14: qty 2

## 2019-07-14 MED ORDER — SODIUM CHLORIDE 0.9 % IV SOLN
INTRAVENOUS | Status: AC | PRN
  Administered 2019-07-14: 10 mL/h via INTRAVENOUS

## 2019-07-14 MED ORDER — FENTANYL CITRATE (PF) 100 MCG/2ML IJ SOLN
INTRAMUSCULAR | Status: AC | PRN
  Administered 2019-07-14: 50 ug via INTRAVENOUS

## 2019-07-14 MED ORDER — OXYCODONE HCL 5 MG PO TABS: 5.0000 mg | ORAL_TABLET | ORAL | Status: AC

## 2019-07-14 MED ORDER — MIDAZOLAM HCL 2 MG/2ML IJ SOLN
INTRAMUSCULAR | Status: AC
  Filled 2019-07-14: qty 2

## 2019-07-14 MED ORDER — LIDOCAINE HCL (PF) 1 % IJ SOLN
INTRAMUSCULAR | Status: AC
  Filled 2019-07-14: qty 30

## 2019-07-14 MED ORDER — OXYCODONE HCL 5 MG PO TABS
ORAL_TABLET | ORAL | Status: AC
  Administered 2019-07-14: 5 mg via ORAL
  Filled 2019-07-14: qty 1

## 2019-07-14 NOTE — Discharge Instructions (Addendum)
Liver Biopsy, Care After These instructions give you information about how to care for yourself after your procedure. Your health care provider may also give you more specific instructions. If you have problems or questions, contact your health care provider. What can I expect after the procedure? After your procedure, it is common to have:  Pain and soreness in the area where the biopsy was done.  Bruising around the area where the biopsy was done.  Sleepiness and fatigue for 1-2 days. Follow these instructions at home: Medicines  Take over-the-counter and prescription medicines only as told by your health care provider.  If you were prescribed an antibiotic medicine, take it as told by your health care provider. Do not stop taking the antibiotic even if you start to feel better.  Do not take medicines such as aspirin and ibuprofen unless your health care provider tells you to take them. These medicines thin your blood and can increase the risk of bleeding.  If you are taking prescription pain medicine, take actions to prevent or treat constipation. Your health care provider may recommend that you: ? Drink enough fluid to keep your urine pale yellow. ? Eat foods that are high in fiber, such as fresh fruits and vegetables, whole grains, and beans. ? Limit foods that are high in fat and processed sugars, such as fried or sweet foods. ? Take an over-the-counter or prescription medicine for constipation. Incision care  Follow instructions from your health care provider about how to take care of your incision. Make sure you: ? Wash your hands with soap and water before you change your bandage (dressing). If soap and water are not available, use hand sanitizer. ? Change your dressing as told by your health care provider. ? Leave stitches (sutures), skin glue, or adhesive strips in place. These skin closures may need to stay in place for 2 weeks or longer. If adhesive strip edges start to  loosen and curl up, you may trim the loose edges. Do not remove adhesive strips completely unless your health care provider tells you to do that.  Check your incision area every day for signs of infection. Check for: ? Redness, swelling, or pain. ? Fluid or blood. ? Warmth. ? Pus or a bad smell.  Do not take baths, swim, or use a hot tub until your health care provider says it is okay to do so. Activity   Rest at home for 1-2 days, or as directed by your health care provider. ? Avoid sitting for a long time without moving. Get up to take short walks every 1-2 hours. This is important to improve blood flow and breathing. Ask for help if you feel weak or unsteady.  Return to your normal activities as told by your health care provider. Ask your health care provider what activities are safe for you.  Do not drive or use heavy machinery while taking prescription pain medicine.  Do not lift anything that is heavier than 10 lb (4.5 kg), or the limit that your health care provider tells you, until he or she says that it is safe.  Do not play contact sports for 2 weeks after the procedure. General instructions   Do not drink alcohol in the first week after the procedure.  Have someone stay with you for at least 24 hours after the procedure.  It is your responsibility to obtain your test results. Ask your health care provider, or the department that is doing the test: ? When will my   results be ready? ? How will I get my results? ? What are my treatment options? ? What other tests do I need? ? What are my next steps?  Keep all follow-up visits as told by your health care provider. This is important. Contact a health care provider if:  You have increased bleeding from an incision, resulting in more than a small spot of blood.  You have redness, swelling, or increasing pain in any incisions.  You notice a discharge or a bad smell coming from any of your incisions.  You have a fever or  chills. Get help right away if:  You develop swelling, bloating, or pain in your abdomen.  You become dizzy or faint.  You develop a rash.  You have nausea or you vomit.  You faint, or you have shortness of breath or difficulty breathing.  You develop chest pain.  You have problems with your speech or vision.  You have trouble with your balance or moving your arms or legs. Summary  After the liver biopsy, it is common to have pain, soreness, and bruising in the area, as well as sleepiness and fatigue.  Take over-the-counter and prescription medicines only as told by your health care provider.  Follow instructions from your health care provider about how to care for your incision. Check the incision area daily for signs of infection. This information is not intended to replace advice given to you by your health care provider. Make sure you discuss any questions you have with your health care provider. Document Revised: 07/29/2018 Document Reviewed: 06/15/2017 Elsevier Patient Education  2020 [employer] Moderate Conscious Sedation, Adult Sedation is the use of medicines to promote relaxation and relieve discomfort and anxiety. Moderate conscious sedation is a type of sedation. Under moderate conscious sedation, you are less alert than normal, but you are still able to respond to instructions, touch, or both. Moderate conscious sedation is used during short medical and dental procedures. It is milder than deep sedation, which is a type of sedation under which you cannot be easily woken up. It is also milder than general anesthesia, which is the use of medicines to make you unconscious. Moderate conscious sedation allows you to return to your regular activities sooner. Tell a health care provider about:  Any allergies you have.  All medicines you are taking, including vitamins, herbs, eye drops, creams, and over-the-counter medicines.  Use of steroids (by mouth or creams).  Any  problems you or family members have had with sedatives and anesthetic medicines.  Any blood disorders you have.  Any surgeries you have had.  Any medical conditions you have, such as sleep apnea.  Whether you are pregnant or may be pregnant.  Any use of cigarettes, alcohol, marijuana, or street drugs. What are the risks? Generally, this is a safe procedure. However, problems may occur, including:  Getting too much medicine (oversedation).  Nausea.  Allergic reaction to medicines.  Trouble breathing. If this happens, a breathing tube may be used to help with breathing. It will be removed when you are awake and breathing on your own.  Heart trouble.  Lung trouble. What happens before the procedure? Staying hydrated Follow instructions from your health care provider about hydration, which may include:  Up to 2 hours before the procedure - you may continue to drink clear liquids, such as water, clear fruit juice, black coffee, and plain tea. Eating and drinking restrictions Follow instructions from your health care provider about eating and drinking, which   may include:  8 hours before the procedure - stop eating heavy meals or foods such as meat, fried foods, or fatty foods.  6 hours before the procedure - stop eating light meals or foods, such as toast or cereal.  6 hours before the procedure - stop drinking milk or drinks that contain milk.  2 hours before the procedure - stop drinking clear liquids. Medicine Ask your health care provider about:  Changing or stopping your regular medicines. This is especially important if you are taking diabetes medicines or blood thinners.  Taking medicines such as aspirin and ibuprofen. These medicines can thin your blood. Do not take these medicines before your procedure if your health care provider instructs you not to.  Tests and exams  You will have a physical exam.  You may have blood tests done to show: ? How well your  kidneys and liver are working. ? How well your blood can clot. General instructions  Plan to have someone take you home from the hospital or clinic.  If you will be going home right after the procedure, plan to have someone with you for 24 hours. What happens during the procedure?  An IV tube will be inserted into one of your veins.  Medicine to help you relax (sedative) will be given through the IV tube.  The medical or dental procedure will be performed. What happens after the procedure?  Your blood pressure, heart rate, breathing rate, and blood oxygen level will be monitored often until the medicines you were given have worn off.  Do not drive for 24 hours. This information is not intended to replace advice given to you by your health care provider. Make sure you discuss any questions you have with your health care provider. Document Revised: 05/18/2017 Document Reviewed: 09/25/2015 Elsevier Patient Education  2020 Elsevier Inc.  

## 2019-07-14 NOTE — H&P (Signed)
Chief Complaint: Patient was seen in consultation today for random liver biopsy at the request of Shahid,Shabana  Referring Physician(s): [id number]  Supervising Physician: Ruel Favors  Patient Status: [facility] - Out-pt  History of Present Illness: Diane Gonzales is a 64 y.o. female   abd pain x 1 yr Worsening over 4 mo Pruritic skin Follows with Dr Cloretta Ned Mildly elevated liver functions CT showing nodular liver  CT 03/2019: IMPRESSION: 1. No definite acute noncontrast CT findings of the abdomen or pelvis to explain severe epigastric or rectal pain. 2. The patient is status post Roux-en-Y gastric bypass. The gastric pouch is mildly fluid in and debris distended. Consider endoscopy to assess for anastomotic stricture of the Roux limb if symptoms are referable. 3. No noncontrast CT abnormality of the rectum. There is pancolonic diverticulosis without evidence of acute diverticulitis. 4. Somewhat nodular contour of the liver without definitive stigmata of cirrhosis. 5.  Status post hysterectomy and cholecystectomy.  [place] Elastography  - ordered per chart Done 3 weeks ago per pt No result in Epic  Scheduled for random  Liver bx  Past Medical History:  Diagnosis Date  . Chronic kidney disease   . Diabetes mellitus without complication (HCC)   . Hypertension     Past Surgical History:  Procedure Laterality Date  . BREAST BIOPSY Right 11/2015   benign per pt  . SPINE SURGERY      Allergies: Ivp dye [iodinated diagnostic agents], Promethazine, Pseudophed-chlophedianol-gg, Toradol [ketorolac tromethamine], and Tramadol  Medications: Prior to Admission medications   Medication Sig Start Date End Date Taking? Authorizing Provider  ALPRAZolam Prudy Feeler) 1 MG tablet Take 1 mg by mouth 3 (three) times daily.   Yes [provider]  aspirin EC 81 MG tablet Take 81 mg by mouth daily.   Yes [provider]  CHOLESTYRAMINE PO Take 4 g by mouth 2  (two) times daily.   Yes [provider]  fluticasone (FLONASE) 50 MCG/ACT nasal spray Place 2 sprays into both nostrils 2 (two) times daily.   Yes [provider]  glipiZIDE (GLUCOTROL) 5 MG tablet Take 5 mg by mouth daily before breakfast.   Yes [provider]  hydrochlorothiazide (HYDRODIURIL) 25 MG tablet Take 25 mg by mouth daily.   Yes [provider]  losartan (COZAAR) 25 MG tablet Take 25 mg by mouth daily.   Yes [provider]  meclizine (ANTIVERT) 12.5 MG tablet Take 12.5 mg by mouth 2 (two) times daily.   Yes Filomena Jungling, NP  metFORMIN (GLUCOPHAGE) 1000 MG tablet Take 1,000 mg by mouth 2 (two) times daily with a meal.    Yes [provider]  metoCLOPramide (REGLAN) 10 MG tablet Take 1 tablet by mouth 2 (two) times daily. 01/10/18  Yes [provider]  omeprazole (PRILOSEC) 40 MG capsule Take 40 mg by mouth 2 (two) times daily.   Yes [provider]  ondansetron (ZOFRAN) 4 MG tablet Take 4 mg by mouth every 8 (eight) hours as needed for nausea or vomiting.   Yes [provider]  oxyCODONE (OXY IR/ROXICODONE) 5 MG immediate release tablet Take 1 tablet by mouth every 6 (six) hours as needed for pain.    Yes Filomena Jungling, NP  ranitidine (ZANTAC) 150 MG tablet Take 150 mg by mouth 2 (two) times daily.   Yes [provider]  simvastatin (ZOCOR) 40 MG tablet Take 40 mg by mouth daily.   Yes [provider]  XIFAXAN 550 MG TABS tablet Take  1 tablet by mouth daily. 01/10/18  Yes [provider]  albuterol (PROVENTIL HFA;VENTOLIN HFA) 108 (90 Base) MCG/ACT inhaler Use 2 puffs every 4-6 hours as needed for Shortness of breath 12/25/17   [provider]  budesonide-formoterol (SYMBICORT) 160-4.5 MCG/ACT inhaler Inhale 2 puffs into the lungs daily.    [provider]  Diclofenac Sodium CR 100 MG 24 hr tablet Take 1 tablet by mouth daily.    [provider]    dicyclomine (BENTYL) 10 MG capsule Take 1 capsule by mouth 4 (four) times daily. 12/21/17   [provider]  ferrous sulfate 325 (65 FE) MG tablet Take 325 mg by mouth daily with breakfast.    [provider]  Insulin Degludec (TRESIBA) 100 UNIT/ML SOLN Inject 18 Units/oz/day into the skin.    [provider]  ipratropium-albuterol (DUONEB) 0.5-2.5 (3) MG/3ML SOLN Take 3 mLs by nebulization 3 (three) times daily.    [provider]  JANUMET XR 50-1000 MG TB24 Use as directed in the mouth or throat once. 02/20/18   [provider]  nystatin (MYCOSTATIN/NYSTOP) powder Apply topically daily.    [provider]  sucralfate (CARAFATE) 1 g tablet Take 1 g by mouth 4 (four) times daily -  with meals and at bedtime.    [provider]     Family History  Problem Relation Age of Onset  . Cancer Father   . Diabetes Father   . Heart disease Father   . Diabetes Brother     Social History   Socioeconomic History  . Marital status: Widowed    Spouse name: Not on file  . Number of children: Not on file  . Years of education: Not on file  . Highest education level: Not on file  Occupational History  . Not on file  Tobacco Use  . Smoking status: Never Smoker  . Smokeless tobacco: Never Used  Substance and Sexual Activity  . Alcohol use: Yes    Comment: occasional  . Drug use: Never  . Sexual activity: Not on file  Other Topics Concern  . Not on file  Social History Narrative  . Not on file   Social Determinants of Health   Financial Resource Strain:   . Difficulty of Paying Living Expenses: Not on file  Food Insecurity:   . Worried About Programme researcher, broadcasting/film/video in the Last Year: Not on file  . Ran Out of Food in the Last Year: Not on file  Transportation Needs:   . Lack of Transportation (Medical): Not on file  . Lack of Transportation (Non-Medical): Not on file  Physical Activity:   . Days of Exercise per Week: Not on file   . Minutes of Exercise per Session: Not on file  Stress:   . Feeling of Stress : Not on file  Social Connections:   . Frequency of Communication with Friends and Family: Not on file  . Frequency of Social Gatherings with Friends and Family: Not on file  . Attends Religious Services: Not on file  . Active Member of Clubs or Organizations: Not on file  . Attends Banker Meetings: Not on file  . Marital Status: Not on file     Review of Systems: A 12 point ROS discussed and pertinent positives are indicated in the HPI above.  All other systems are negative.  Review of Systems  Constitutional: Positive for activity change, appetite change and fatigue. Negative for fever.  Respiratory: Positive for  shortness of breath. Negative for cough.   Cardiovascular: Negative for chest pain.  Gastrointestinal: Positive for abdominal distention, abdominal pain and rectal pain.  Psychiatric/Behavioral: Negative for behavioral problems and confusion.    Vital Signs: BP 134/72   Pulse 91   Temp 97.9 F (36.6 C) (Oral)   Resp 16   Ht 5\' 2"  (1.575 m)   Wt 205 lb (93 kg)   SpO2 99%   BMI 37.49 kg/m   Physical Exam Vitals reviewed.  Cardiovascular:     Rate and Rhythm: Normal rate and regular rhythm.     Heart sounds: Normal heart sounds.  Pulmonary:     Effort: Pulmonary effort is normal.     Breath sounds: Normal breath sounds.  Abdominal:     General: There is distension.     Tenderness: There is abdominal tenderness.  Musculoskeletal:        General: Normal range of motion.  Skin:    General: Skin is warm and dry.  Neurological:     Mental Status: She is alert and oriented to person, place, and time.  Psychiatric:        Mood and Affect: Mood normal.        Behavior: Behavior normal.        Thought Content: Thought content normal.        Judgment: Judgment normal.     Imaging: No results found.  Labs:  CBC: Recent Labs    07/14/19 0630  WBC 10.5  HGB  10.5*  HCT 33.7*  PLT 220    COAGS: No results for input(s): INR, APTT in the last 8760 hours.  BMP: No results for input(s): NA, K, CL, CO2, GLUCOSE, BUN, CALCIUM, CREATININE, GFRNONAA, GFRAA in the last 8760 hours.  Invalid input(s): CMP  LIVER FUNCTION TESTS: No results for input(s): BILITOT, AST, ALT, ALKPHOS, PROT, ALBUMIN in the last 8760 hours.  TUMOR MARKERS: No results for input(s): AFPTM, CEA, CA199, CHROMGRNA in the last 8760 hours.  Assessment and Plan:  abd pain; worsening Elavted LFTs and pruritis Nodular contour of liver on imaging Scheduled for random liver bx Risks and benefits of random liver bx was discussed with the patient and/or patient's family including, but not limited to bleeding, infection, damage to adjacent structures or low yield requiring additional tests.  All of the questions were answered and there is agreement to proceed. Consent signed and in chart.  Thank you for this interesting consult.  I greatly enjoyed meeting Diane Gonzales and look forward to participating in their care.  A copy of this report was sent to the requesting provider on this date.  Electronically Signed: Lavonia Drafts, PA-C 07/14/2019, 7:27 AM   I spent a total of  30 Minutes   in face to face in clinical consultation, greater than 50% of which was counseling/coordinating care for random liver bx

## 2019-07-14 NOTE — Procedures (Signed)
Fatty liver, inc'd LFTs  S/p US LIVER RANDOM CORE BX  No comp Stable ebl min Path pending Full report in pacs

## 2019-07-15 ENCOUNTER — Other Ambulatory Visit: Payer: Self-pay | Admitting: Physician Assistant

## 2019-07-15 LAB — SURGICAL PATHOLOGY

## 2019-08-27 ENCOUNTER — Other Ambulatory Visit: Payer: Self-pay

## 2019-08-27 DIAGNOSIS — N189 Chronic kidney disease, unspecified: Secondary | ICD-10-CM | POA: Insufficient documentation

## 2019-08-27 DIAGNOSIS — K7460 Unspecified cirrhosis of liver: Secondary | ICD-10-CM | POA: Insufficient documentation

## 2019-08-28 NOTE — Patient Outreach (Signed)
[facility]) Care Management Chronic Special Needs Program  08/28/2019  Late entry for 08/27/19 Name: Diane Gonzales DOB: 1956/05/27  MRN: [id number]  Diane Gonzales is enrolled in a chronic special needs plan for Diabetes. Chronic Care Management Coordinator telephoned client to review health risk assessment and to develop individualized care plan. HIPAA verified.  Introduced the chronic care management program, importance of client participation, and taking their care plan to all provider appointments and inpatient facilities.  .  Subjective: Client states she is currently managing diagnosis of diabetes, irritable bowel syndrome, kidney and liver disease.  Client states she was  told by her gastroenterologist approximately 3-4 weeks ago that she had liver and kidney disease.  She reports her liver disease is stage 4.  She states she is pretty overwhelmed with these new diagnoses.  Client states she's had diabetes since her early 20's.  She reports she knows her diabetes is not under control right now. She states her most recent A1c was 11.  Client state she checks her blood sugars daily. She states her fasting blood sugar on 08/26/09 was 266 and her evening blood sugar was 380.   Client reports her blood sugar range has been from 157 to Hi. Client reports she saw her primary MD last in February 2021. She states she has transportation to appointments from her daughter and using the [employer] service.  Client states she is moving to [place] on Monday 09/01/19.  She states she will work on finding a primary care provider and gastroenterologist when she gets to the [place] area.  RNCM discussed with client chronic kidney and liver disease. Client states she knows if she can get her diabetes under control this could help her kidneys.  RNCM offered to send client education articles regarding kidney and liver disease. Client verbalized agreement and appreciation.   Goals Addressed              This Visit's Progress   .  Acknowledge receipt of Advanced Directive package       RN case manager will send client Advanced directive packet Advised client to call RN case manager If assistance is needed.     Marland Kitchen "I want to work on my diabetes" (pt-stated)       Discussed diabetes self management actions:  Glucose monitoring per provider recommendation  Check feet daily  Visit provider every 3-6 months as directed  Hbg A1C level every 3-6 months.  Eye Exam yearly  Carbohydrate controlled meal planning  Taking diabetes medication as prescribed by provider  Physical activity      . Client understands the importance of follow-up with providers by attending scheduled visits       Client reports adherence to provider appointments.   Review of medical record indicates client is attending follow up appointments with providers.  Completed primary care visit 05/29/19 Continue to follow up  with your providers as recommended.     . Client will report improved coping within 3 months.       Client advised to follow up with therapist/ psychiatrist. Client advised to notify her primary care provider of depression/ anxiety symptoms Advised client to continue to take her medications as prescribed.  RN care manager will send client education article: Coping with a health condition: What you can do    . Client will verbalize knowledge of self management of Hypertension as evidences by BP reading of 140/90 or less; or as defined by provider  RN care manager will send client education article: High blood pressure: What you can do Take your medications as prescribed.  Contact your doctor for questions Ask your doctor, " what is my target blood pressure Follow up with your doctor as recommended. Monitor your blood pressure and take results to your doctor appointments    . HEMOGLOBIN A1C < 7.0       Discussed diabetes self management actions:  Glucose monitoring per provider  recommendation  Visit provider every 3-6 months as directed  Hbg A1C level every 3-6 months.  Carbohydrate controlled meal planning (continue to follow up with nutritionist for diabetic meal planning.   Taking diabetes medication as prescribed by provider  Physical activity      . Maintain timely refills of diabetic medication as prescribed within the year .       Client reports good medication taking behavior Review of medical record indicates client maintains timely refills of diabetic medications Continue to take your medications as prescribed.  Follow up with your doctor if you have questions Contact your assigned RN case manager if you have difficulty obtaining your medications     . Obtain annual  Lipid Profile, LDL-C       The goal for your LDL is less than 70 mg/ dl as you are at risk for complications try to avoid saturated fats, trans-fats, and eat more fiber. RN case manager will send client education article: Heart healthy diet.     . Obtain Annual Eye (retinal)  Exam        Client reports having an eye exam 2-3 months ago and receiving new eye glass prescription Discussed importance of having a yearly eye exam.     . Obtain Annual Foot Exam       It is important to have a diabetic foot exam by your doctor at least 1-2 times per year Diabetes foot care - Check feet daily at home (look for skin color changes, cuts, sores or cracks in the skin, swelling of feet or ankles, ingrown or fungal toenails, corn or calluses). Report these findings to your doctor - Wash feet with soap and water, dry feet well especially between toes - Moisturize your feet but not between the toes - Always wear shoes that protect your whole feet.      . Obtain annual screen for micro albuminuria (urine) , nephropathy (kidney problems)       Attend yearly physicals and follow up visits with your providers and complete labs as recommended.     . Obtain Hemoglobin A1C at least 2 times per year        Client reports her most recent A1c is 11. Discussed with client importance of consistent follow up with provider for exam and lab work.  Continue to keep your follow up appointments with your provider and have lab work completed as recommended.     . Patient Stated: " I want to know about my kidney disease"       RN case manager discussed kidney disease RN case manager will send client education article: chronic kidney disease Call your assigned RN case manager if you have questions Follow up with your doctor if you have questions.     . Patient Stated: "I want to know about liver disease."       RN discussed liver disease symptoms, avoid alcohol, talk with your doctor before starting or taking any new medications including pain killers, ibuprofen, naproxen, or acetaminophen. RN case Production designer, theatre/television/film  will send client education article: Cirrhosis, Liver diet    . Visit Primary Care Provider or Endocrinologist at least 2 times per year        Primary care provider visit 05/28/20 Call and schedule a yearly physical  with your provider as recommended.      Assessment: PHQ2 - 6,  PHQ9- 21.  Client states she's had a history of anxiety and depression. Reports having a psychiatrist and counselor in the past. She states she does not need anyone to tell her how to manage her depression/ anxiety symptoms at this time.  Client iis not meeting diabetes self-management goal of hemoglobin A1C of <7.0% with most recent reading of 11% on February 2021 without reports of hypoglycemia . Client reports she is moving to [place] on Monday August 04, 2019.  RNCM provided client contact phone number for [facility] to assist with obtaining her insurance in [place] area.  RNCM advised client to get a copy of her records from her provider offices. Client provided contact RNCM contact phone number and 24 hour nurse advise line number. Client advised call for questions or concerns if needed prior to transition to [place].  Client has  good understanding of:  COVID-19 cause, symptoms, precautions (social distancing, stay at home order, hand washing, wear face covering when unable to maintain or ensure 6 foot social distancing), and symptoms requiring provider notification.  Plan:  Send successful outreach letter with a copy of their individualized care plan, Send individual care plan to provider and Send educational material      George Ina RN,BSN,CCM Chronic Care Management Coordinator [facility] Care Management [phone]

## 2019-09-25 ENCOUNTER — Other Ambulatory Visit: Payer: Self-pay

## 2019-09-25 NOTE — Patient Outreach (Signed)
  [facility]) Care Management Chronic Special Needs Program    09/25/2019  Name: Diane Gonzales, DOB: Oct 08, 1955  MRN: [id number]   [name] is enrolled in a chronic special needs plan for Diabetes. Received notification that client has dis enrolled from the [employer] C-SNP plan. RNCM will no longer follow as clients C-SNP chronic care management coordinator.  [employer] to notify client of dis enrollment from plan.   PLAN: RNCM will send case closure letter to client's primary care provider.   George Ina RN,BSN,CCM Chronic Care Management Coordinator [facility] Care Management [phone]

## 2019-11-10 IMAGING — CR DG UGI W/ SMALL BOWEL HIGH DENSITY
8 of 14 series · 12 of 24 positions shown · non-contrast
Comparison: None.

CLINICAL DATA: Dumping syndrome.

EXAM:
UPPER GI SERIES WITH SMALL BOWEL FOLLOW-THROUGH
FLUOROSCOPY TIME:  Fluoroscopy Time:
Radiation Exposure Index (if provided by the fluoroscopic device):
227 mGy
Number of Acquired Spot Images: 10
TECHNIQUE: Combined double contrast and single contrast upper GI series using
effervescent crystals, thick barium, and thin barium. Subsequently,
serial images of the small bowel were obtained including spot views
of the terminal ileum.

[Series 2: cp_standard · 0.51mm/px · 2 of 42 frames shown (1 of 4)]
[frame 7/42]
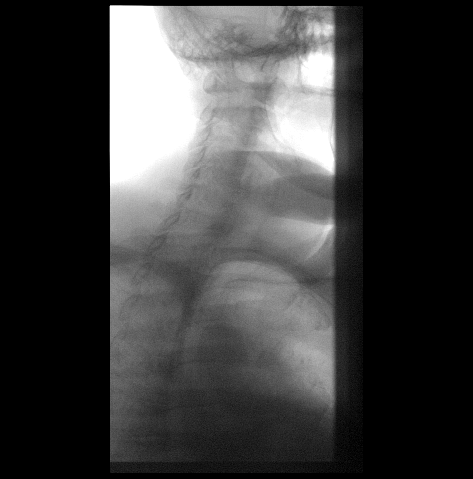
[frame 25/42]
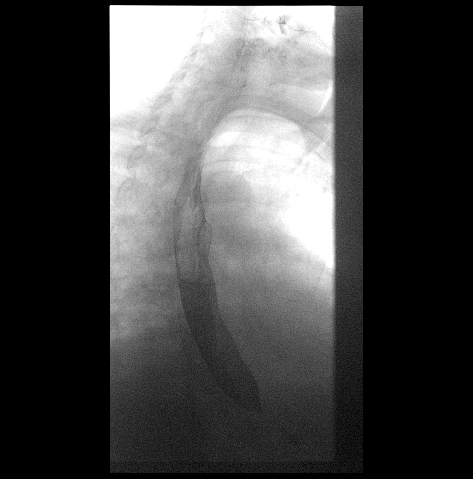

[Series 3: cp_standard · 0.51mm/px · 2 of 27 frames shown (2 of 4)]
[frame 9/27]
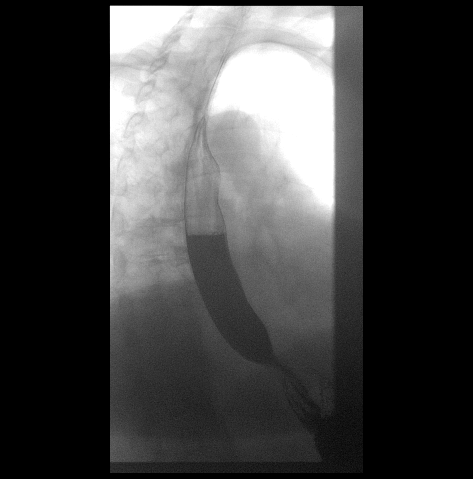
[frame 23/27]
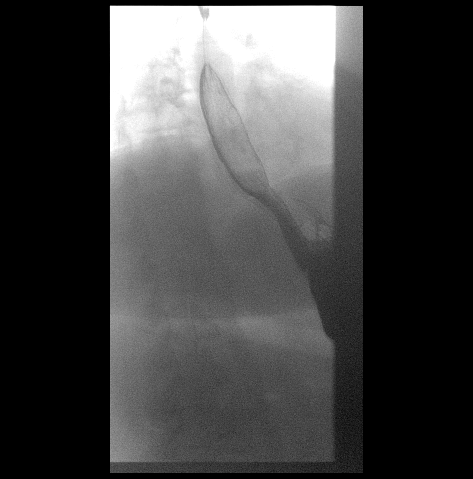

[fluoro_barium singleshot_bw (1 of 2)]
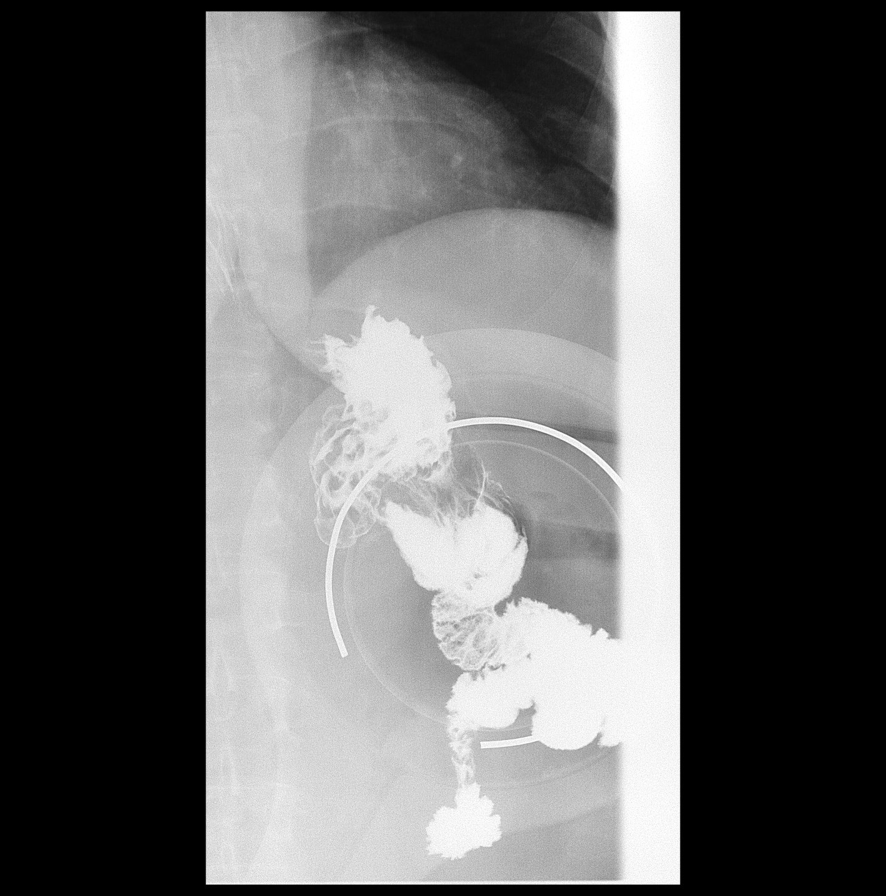

[fluoro_barium singleshot_bw (2 of 2)]
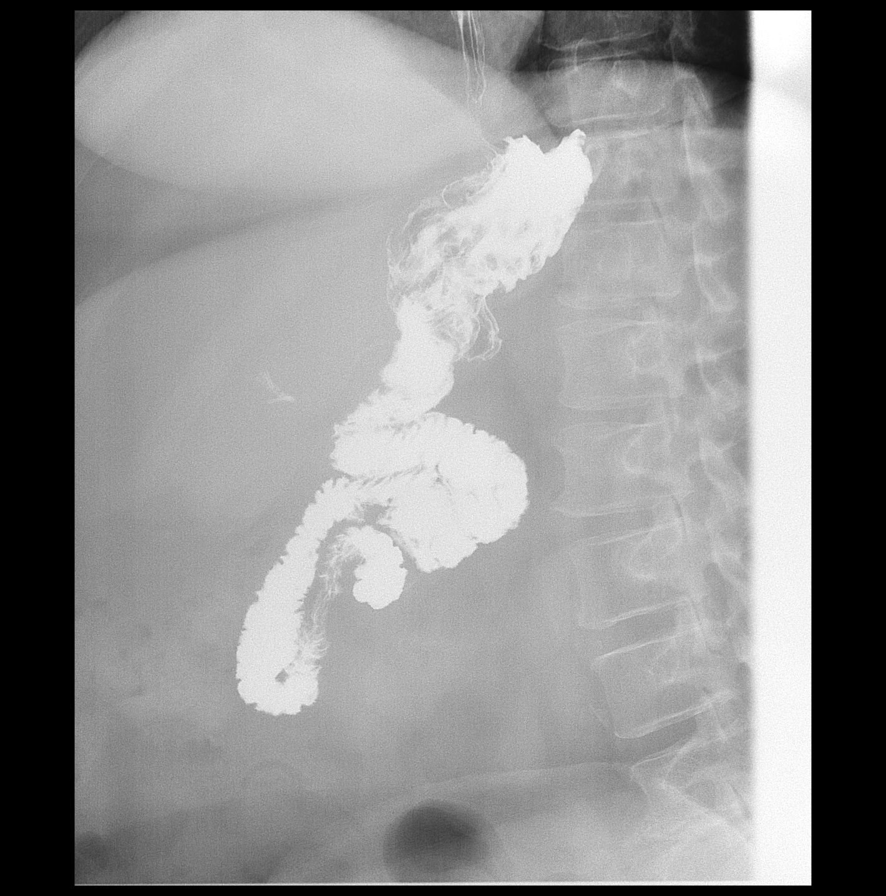

[Series 9: cp_standard · 0.51mm/px · 2 of 29 frames shown (3 of 4)]
[frame 15/29]
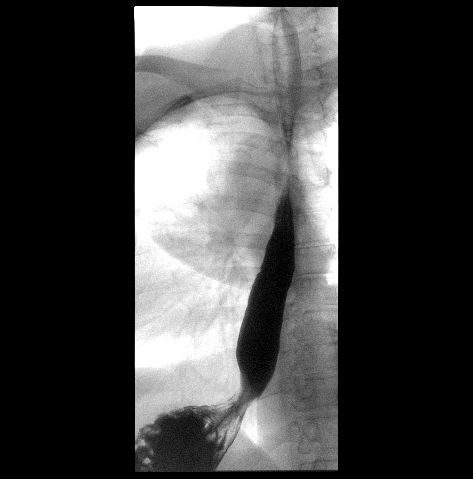
[frame 25/29]
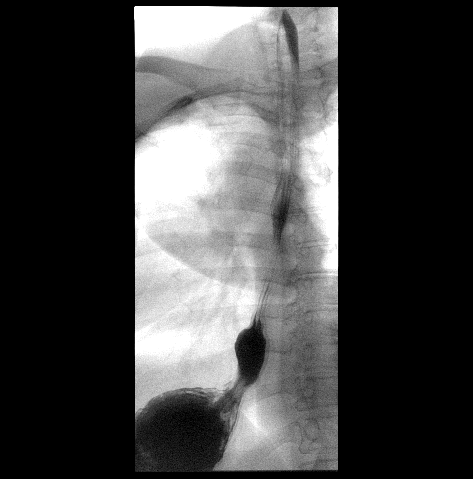

[t abdomen barium pa]
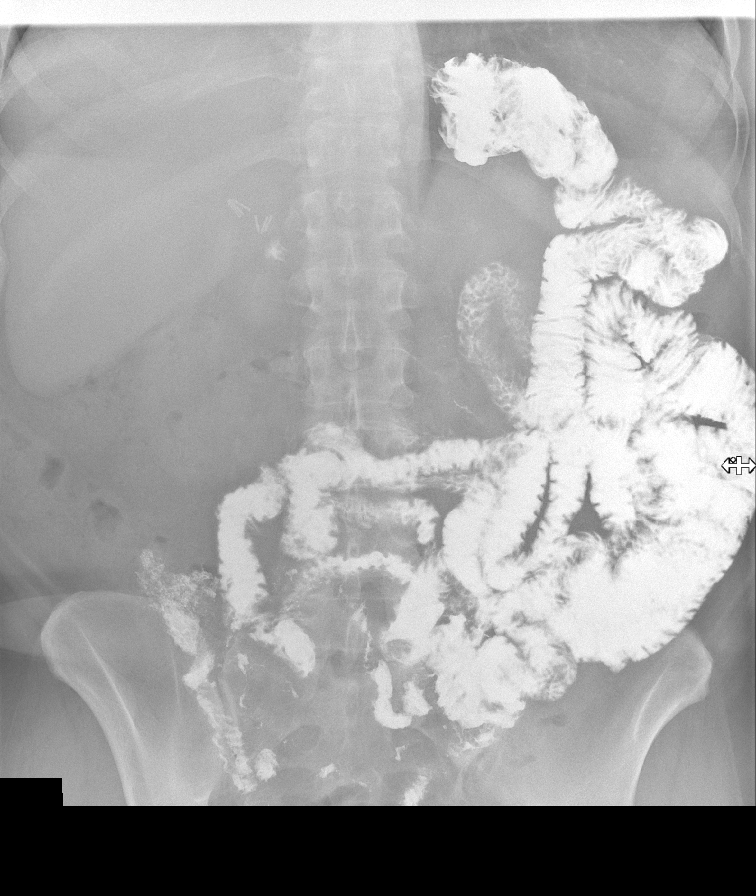

[Series 13: cp_standard · 0.51mm/px · 2 of 39 frames shown (4 of 4)]
[frame 5/39]
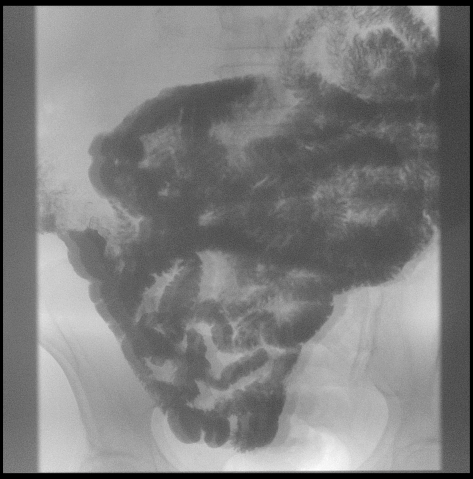
[frame 34/39]
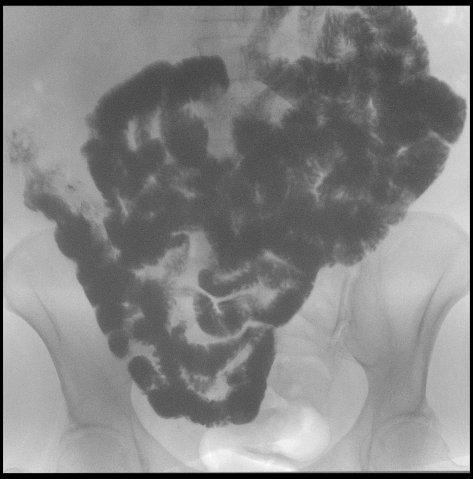

[fluoro_iodine_singleshot_bw]
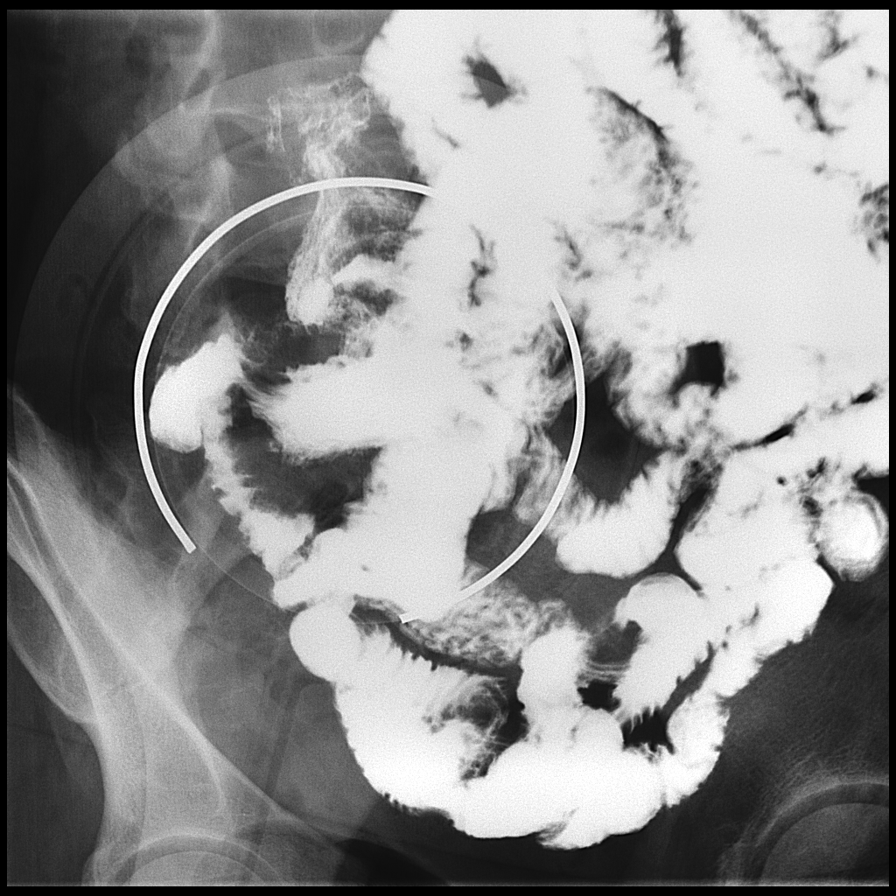

[12 of 24 positions shown; findings below may reference images not displayed]

FINDINGS: Scout image shows a normal bowel gas pattern. There are changes of
gastric bypass. Cholecystectomy clips. No concerning mass effect. No
abnormal stool retention.

Oblique pharyngeal imaging is negative for aspiration or obstructive
process. No diverticulum.

The esophagus has normal distensibility and smooth mucosal contour.
No hiatal hernia.

Postoperative stomach correlating with history of gastric bypass.
The gastric folds are prominent but not definitively thickened for
degree of distention. No fold distortion or ulceration is seen.
Patient reports recent endoscopy. The gastroenteric anastomosis is
widely patent and there is no delayed passage. Small bowel
opacification on early images was concerning for generalized mucosal
disease due to feathery appearance, but this did not persist on the
delayed phase and is most likely due to incomplete coating. There
was no fold thickening or nodularity. No significant reflux of
contrast into the biliary limb. The terminal ileum was negative for
definite inflammatory change. No sacculation. Small bowel was more
prone to the right than left pelvis, attributed to sigmoid colon.
There was a recent lumbar spine MRI with no left lower quadrant mass
seen.
IMPRESSION: 1. Typical changes of gastric bypass. No marginal ulceration or
anastomotic stricture.
2. No significant reflux of the biliary limb.
3. Otherwise negative small bowel series.
4. Negative esophagram.
5. Barium reached the terminal ileum at 30 minutes.
